# Patient Record
Sex: Male | Born: 1950 | Race: White | Hispanic: No | Marital: Married | Smoking: Never smoker
Health system: Southern US, Community
[De-identification: ages and names within clinical notes are randomized; demographics above are authoritative.]

## PROBLEM LIST (undated history)

## (undated) DIAGNOSIS — N400 Enlarged prostate without lower urinary tract symptoms: Secondary | ICD-10-CM

## (undated) DIAGNOSIS — M549 Dorsalgia, unspecified: Secondary | ICD-10-CM

## (undated) DIAGNOSIS — G8929 Other chronic pain: Secondary | ICD-10-CM

## (undated) DIAGNOSIS — H547 Unspecified visual loss: Secondary | ICD-10-CM

## (undated) DIAGNOSIS — S199XXA Unspecified injury of neck, initial encounter: Secondary | ICD-10-CM

## (undated) DIAGNOSIS — E7800 Pure hypercholesterolemia, unspecified: Secondary | ICD-10-CM

## (undated) DIAGNOSIS — Z9889 Other specified postprocedural states: Secondary | ICD-10-CM

## (undated) DIAGNOSIS — E785 Hyperlipidemia, unspecified: Secondary | ICD-10-CM

## (undated) DIAGNOSIS — B059 Measles without complication: Secondary | ICD-10-CM

## (undated) DIAGNOSIS — K219 Gastro-esophageal reflux disease without esophagitis: Secondary | ICD-10-CM

## (undated) DIAGNOSIS — B019 Varicella without complication: Secondary | ICD-10-CM

## (undated) DIAGNOSIS — E119 Type 2 diabetes mellitus without complications: Secondary | ICD-10-CM

## (undated) DIAGNOSIS — B269 Mumps without complication: Secondary | ICD-10-CM

## (undated) HISTORY — DX: Varicella without complication: B01.9

## (undated) HISTORY — DX: Unspecified visual loss: H54.7

## (undated) HISTORY — DX: Dorsalgia, unspecified: M54.9

## (undated) HISTORY — DX: Other chronic pain: G89.29

## (undated) HISTORY — DX: Mumps without complication: B26.9

## (undated) HISTORY — DX: Other specified postprocedural states: Z98.890

## (undated) HISTORY — DX: Gastro-esophageal reflux disease without esophagitis: K21.9

## (undated) HISTORY — DX: Type 2 diabetes mellitus without complications: E11.9

## (undated) HISTORY — PX: ADENOIDECTOMY: SUR15

## (undated) HISTORY — DX: Pure hypercholesterolemia, unspecified: E78.00

## (undated) HISTORY — PX: BACK SURGERY: SHX140

## (undated) HISTORY — DX: Measles without complication: B05.9

## (undated) HISTORY — PX: TONSILLECTOMY: SUR1361

## (undated) HISTORY — DX: Hyperlipidemia, unspecified: E78.5

## (undated) HISTORY — DX: Benign prostatic hyperplasia without lower urinary tract symptoms: N40.0

## (undated) HISTORY — PX: SPINE SURGERY: SHX786

## (undated) HISTORY — PX: GALLBLADDER SURGERY: SHX652

## (undated) HISTORY — DX: Unspecified injury of neck, initial encounter: S19.9XXA

---

## 2012-01-20 HISTORY — PX: COLONOSCOPY: SHX174

## 2017-09-19 DIAGNOSIS — E119 Type 2 diabetes mellitus without complications: Secondary | ICD-10-CM | POA: Insufficient documentation

## 2017-09-19 DIAGNOSIS — N521 Erectile dysfunction due to diseases classified elsewhere: Secondary | ICD-10-CM | POA: Insufficient documentation

## 2017-09-19 DIAGNOSIS — E782 Mixed hyperlipidemia: Secondary | ICD-10-CM | POA: Insufficient documentation

## 2017-11-01 DIAGNOSIS — R42 Dizziness and giddiness: Secondary | ICD-10-CM | POA: Insufficient documentation

## 2017-11-01 DIAGNOSIS — E119 Type 2 diabetes mellitus without complications: Secondary | ICD-10-CM | POA: Insufficient documentation

## 2017-11-01 DIAGNOSIS — R634 Abnormal weight loss: Secondary | ICD-10-CM | POA: Insufficient documentation

## 2017-11-01 DIAGNOSIS — J329 Chronic sinusitis, unspecified: Secondary | ICD-10-CM | POA: Insufficient documentation

## 2017-11-01 DIAGNOSIS — F4310 Post-traumatic stress disorder, unspecified: Secondary | ICD-10-CM | POA: Insufficient documentation

## 2017-11-01 DIAGNOSIS — R030 Elevated blood-pressure reading, without diagnosis of hypertension: Secondary | ICD-10-CM | POA: Insufficient documentation

## 2017-11-01 DIAGNOSIS — I4510 Unspecified right bundle-branch block: Secondary | ICD-10-CM | POA: Insufficient documentation

## 2017-11-01 DIAGNOSIS — I7121 Aneurysm of the ascending aorta, without rupture: Secondary | ICD-10-CM

## 2017-11-01 DIAGNOSIS — S46019A Strain of muscle(s) and tendon(s) of the rotator cuff of unspecified shoulder, initial encounter: Secondary | ICD-10-CM | POA: Insufficient documentation

## 2017-11-01 DIAGNOSIS — M25519 Pain in unspecified shoulder: Secondary | ICD-10-CM | POA: Insufficient documentation

## 2017-11-01 DIAGNOSIS — M545 Low back pain, unspecified: Secondary | ICD-10-CM | POA: Insufficient documentation

## 2017-11-01 DIAGNOSIS — G8929 Other chronic pain: Secondary | ICD-10-CM

## 2017-11-01 DIAGNOSIS — N400 Enlarged prostate without lower urinary tract symptoms: Secondary | ICD-10-CM | POA: Insufficient documentation

## 2017-11-01 DIAGNOSIS — R197 Diarrhea, unspecified: Secondary | ICD-10-CM | POA: Insufficient documentation

## 2017-11-01 DIAGNOSIS — K219 Gastro-esophageal reflux disease without esophagitis: Secondary | ICD-10-CM | POA: Insufficient documentation

## 2017-11-01 DIAGNOSIS — R0789 Other chest pain: Secondary | ICD-10-CM | POA: Insufficient documentation

## 2017-11-01 DIAGNOSIS — R5383 Other fatigue: Secondary | ICD-10-CM | POA: Insufficient documentation

## 2017-11-01 DIAGNOSIS — E559 Vitamin D deficiency, unspecified: Secondary | ICD-10-CM | POA: Insufficient documentation

## 2017-11-01 DIAGNOSIS — E785 Hyperlipidemia, unspecified: Secondary | ICD-10-CM

## 2017-11-01 DIAGNOSIS — R079 Chest pain, unspecified: Secondary | ICD-10-CM | POA: Insufficient documentation

## 2017-11-01 DIAGNOSIS — S39012A Strain of muscle, fascia and tendon of lower back, initial encounter: Secondary | ICD-10-CM | POA: Insufficient documentation

## 2017-11-01 DIAGNOSIS — Q2543 Congenital aneurysm of aorta: Secondary | ICD-10-CM | POA: Insufficient documentation

## 2017-11-01 DIAGNOSIS — R0609 Other forms of dyspnea: Secondary | ICD-10-CM | POA: Insufficient documentation

## 2017-11-01 DIAGNOSIS — I719 Aortic aneurysm of unspecified site, without rupture: Secondary | ICD-10-CM | POA: Insufficient documentation

## 2017-11-01 DIAGNOSIS — R911 Solitary pulmonary nodule: Secondary | ICD-10-CM

## 2017-11-22 ENCOUNTER — Ambulatory Visit (INDEPENDENT_AMBULATORY_CARE_PROVIDER_SITE_OTHER): Payer: Medicare Other | Admitting: Interventional Cardiology

## 2017-11-22 ENCOUNTER — Encounter (INDEPENDENT_AMBULATORY_CARE_PROVIDER_SITE_OTHER): Payer: Self-pay

## 2017-11-22 ENCOUNTER — Encounter: Payer: Self-pay | Admitting: Interventional Cardiology

## 2017-11-22 VITALS — BP 110/74 | HR 82 | Ht 73.0 in | Wt 183.8 lb

## 2017-11-22 DIAGNOSIS — I7789 Other specified disorders of arteries and arterioles: Secondary | ICD-10-CM | POA: Diagnosis not present

## 2017-11-22 DIAGNOSIS — I719 Aortic aneurysm of unspecified site, without rupture: Secondary | ICD-10-CM | POA: Diagnosis not present

## 2017-11-22 DIAGNOSIS — I7121 Aneurysm of the ascending aorta, without rupture: Secondary | ICD-10-CM

## 2017-11-22 DIAGNOSIS — I4510 Unspecified right bundle-branch block: Secondary | ICD-10-CM

## 2017-11-22 DIAGNOSIS — E119 Type 2 diabetes mellitus without complications: Secondary | ICD-10-CM | POA: Diagnosis not present

## 2017-11-22 DIAGNOSIS — E782 Mixed hyperlipidemia: Secondary | ICD-10-CM | POA: Diagnosis not present

## 2017-11-22 NOTE — Patient Instructions (Signed)
Medication Instructions:  Your physician recommends that you continue on your current medications as directed. Please refer to the Current Medication list given to you today.  Labwork: BMET today  Testing/Procedures: Your physician would like for you to have a CT angio or your Aorta.  Follow-Up: Your physician wants you to follow-up in: 1 year with Dr. Katrinka BlazingSmith.  You will receive a reminder letter in the mail two months in advance. If you don't receive a letter, please call our office to schedule the follow-up appointment.   Any Other Special Instructions Will Be Listed Below (If Applicable).     If you need a refill on your cardiac medications before your next appointment, please call your pharmacy.

## 2017-11-22 NOTE — Progress Notes (Signed)
Cardiology Office Note    Date:  11/22/2017   ID:  Scott Nichols, DOB [birth year], MRN [id number]  PCP:  Herma Carson, MD  Cardiologist: Lesleigh Noe, MD   Chief Complaint  Patient presents with  . Thoracic Aortic Aneurysm    History of Present Illness:  Scott Nichols is a 67 y.o. male with long-standing diabetes mellitus type 2, family history of CAD, family history of aortic dissection, personal history of right bundle branch block, and previous documentation of dilated aorta on echocardiography 2016.  The patient is asymptomatic.  He is diabetic for greater than 15 years..  He denies chest pain, orthopnea, PND, swelling, syncope, back pain, and edema.  He has had negative ischemic evaluations with stress echo test performed as recently as 18 months ago.  Most recent echo studies have not demonstrated dilated aorta.  Multiple noncontrast CT studies of the chest have not confirmed aortic enlargement.  No dedicated aortic studies have been done based upon my review of records.  We spent time discussing right bundle branch block and clinical implications.  We spent time discussing the aortic root enlargement.  The quoted value is 4 cm.  It was made by echo.  We need to exclude the possibility of a tangential cut resulting in falsely elevated diameters.  This is especially important given lack of confirmation of aortic enlargement on noncontrast chest CTs.  Past Medical History:  Diagnosis Date  . Back pain    back problems - herniated disc  . Chicken pox   . Chronic pain   . Diabetes mellitus without complication (HCC)   . Enlarged prostate   . GERD (gastroesophageal reflux disease)   . H/O colonoscopy   . High cholesterol   . Hyperlipidemia   . Measles   . Mumps   . Neck injury    during [place] War  . Vision problems       Current Medications: Outpatient Medications Prior to Visit  Medication Sig Dispense Refill  . citalopram (CELEXA) 20 MG tablet Take 20 mg by  mouth daily.    . Flaxseed, Linseed, (FLAXSEED OIL) 1000 MG CAPS Take 1,000 mg by mouth daily.    Marland Kitchen JANUVIA 50 MG tablet Take 50 mg by mouth daily.  0  . rosuvastatin (CRESTOR) 5 MG tablet Take 5 mg by mouth daily.    . sildenafil (VIAGRA) 100 MG tablet Take 100 mg by mouth daily as needed for erectile dysfunction.    . Cholecalciferol (VITAMIN D3) 5000 units CAPS Take 5,000 Units by mouth 3 (three) times a week. (Mondays, Wednesdays, and Fridays)    . Cyanocobalamin (VITAMIN B-12 PO) Take 2,500 mcg by mouth daily.    . metFORMIN (GLUCOPHAGE) 500 MG tablet Take 1,000 mg by mouth 2 (two) times daily with a meal.    . pioglitazone (ACTOS) 15 MG tablet Take 15 mg by mouth daily.     No facility-administered medications prior to visit.      Allergies:   Metformin and Zocor [simvastatin]   Social History   Socioeconomic History  . Marital status: Married    Spouse name: None  . Number of children: None  . Years of education: None  . Highest education level: None  Social Needs  . Financial resource strain: None  . Food insecurity - worry: None  . Food insecurity - inability: None  . Transportation needs - medical: None  . Transportation needs - non-medical: None  Occupational History  . None  Tobacco Use  . Smoking status: Never Smoker  . Smokeless tobacco: Never Used  Substance and Sexual Activity  . Alcohol use: No    Frequency: Never  . Drug use: No  . Sexual activity: None  Other Topics Concern  . None  Social History Narrative  . None     Family History:  The patient's family history includes Cancer - Prostate in his father; Congestive Heart Failure in his mother; Diabetes in his father and mother; Heart disease in his mother; Other (age of onset: 85) in his brother.   ROS:   Please see the history of present illness.    Negative review of systems. All other systems reviewed and are negative.   PHYSICAL EXAM:   VS:  BP 110/74   Pulse 82   Ht 6\' 1"  (1.854 m)    Wt 183 lb 12.8 oz (83.4 kg)   BMI 24.25 kg/m    GEN: Well nourished, well developed, in no acute distress  HEENT: normal  Neck: no JVD, carotid bruits, or masses Cardiac: RRR; no murmurs, rubs, or gallops,no edema  Respiratory:  clear to auscultation bilaterally, normal work of breathing GI: soft, nontender, nondistended, + BS MS: no deformity or atrophy  Skin: warm and dry, no rash Neuro:  Alert and Oriented x 3, Strength and sensation are intact Psych: euthymic mood, full affect  Wt Readings from Last 3 Encounters:  11/22/17 183 lb 12.8 oz (83.4 kg)      Studies/Labs Reviewed:   EKG:  EKG sinus rhythm, right bundle branch block, otherwise unremarkable.  No change when compared to historical EKGs performed in [place] in 2016.,  Recent Labs: No results found for requested labs within last 8760 hours.   Lipid Panel No results found for: CHOL, TRIG, HDL, CHOLHDL, VLDL, LDLCALC, LDLDIRECT  Additional studies/ records that were reviewed today include:  Reviewed stress echo from 2016 and multiple noncontrast CT reports.    ASSESSMENT:    1. Right bundle branch block   2. Type 2 diabetes mellitus without complication, without long-term current use of insulin (HCC)   3. Aneurysm of aortic root (HCC)   4. Mixed hyperlipidemia   5. Aortic root enlargement (HCC)      PLAN:  In order of problems listed above:  1. Unchanged 2. Managed by primary care 3. We will perform a CT angiogram of the aorta with contrast to define the presence or absence of aortic enlargement given the patient's family history of aortic dissection.  His younger brother had marfanoid features. 4. We discussed lipids and LDL target less than 70.  Clinical follow-up in 1 year.  We will discuss CT results once available.  Medication Adjustments/Labs and Tests Ordered: Current medicines are reviewed at length with the patient today.  Concerns regarding medicines are outlined above.  Medication  changes, Labs and Tests ordered today are listed in the Patient Instructions below. Patient Instructions  Medication Instructions:  Your physician recommends that you continue on your current medications as directed. Please refer to the Current Medication list given to you today.  Labwork: BMET today  Testing/Procedures: Your physician would like for you to have a CT angio or your Aorta.  Follow-Up: Your physician wants you to follow-up in: 1 year with Dr. Katrinka Blazing.  You will receive a reminder letter in the mail two months in advance. If you don't receive a letter, please call our office to schedule the follow-up appointment.   Any Other Special Instructions Will Be Listed  Below (If Applicable).     If you need a refill on your cardiac medications before your next appointment, please call your pharmacy.      Signed, Lesleigh NoeHenry W Akaila Rambo III, MD  11/22/2017 2:39 PM    [facility] [address], [place], [place]  [address] Phone: [phone]; Fax: [phone]

## 2017-11-27 ENCOUNTER — Other Ambulatory Visit: Payer: Medicare Other

## 2017-11-27 LAB — BASIC METABOLIC PANEL
BUN/Creatinine Ratio: 15 (ref 10–24)
BUN: 17 mg/dL (ref 8–27)
CHLORIDE: 101 mmol/L (ref 96–106)
CO2: 22 mmol/L (ref 20–29)
CREATININE: 1.11 mg/dL (ref 0.76–1.27)
Calcium: 9.9 mg/dL (ref 8.6–10.2)
GFR calc Af Amer: 79 mL/min/{1.73_m2} (ref >59)
GFR calc non Af Amer: 68 mL/min/{1.73_m2} (ref >59)
GLUCOSE: 212 mg/dL — AB (ref 65–99)
Potassium: 5.1 mmol/L (ref 3.5–5.2)
SODIUM: 137 mmol/L (ref 134–144)

## 2017-12-04 ENCOUNTER — Ambulatory Visit (INDEPENDENT_AMBULATORY_CARE_PROVIDER_SITE_OTHER)
Admission: RE | Admit: 2017-12-04 | Discharge: 2017-12-04 | Disposition: A | Discharge status: Home / Self Care | Payer: Medicare Other | Source: Ambulatory Visit | Attending: Interventional Cardiology | Admitting: Interventional Cardiology

## 2017-12-04 DIAGNOSIS — I7789 Other specified disorders of arteries and arterioles: Secondary | ICD-10-CM

## 2017-12-04 MED ORDER — IOPAMIDOL (ISOVUE-370) INJECTION 76%
100.0000 mL | Freq: Once | INTRAVENOUS | Status: AC | PRN
  Administered 2017-12-04: 100 mL via INTRAVENOUS

## 2017-12-05 ENCOUNTER — Telehealth: Payer: Self-pay | Admitting: *Deleted

## 2017-12-05 DIAGNOSIS — R911 Solitary pulmonary nodule: Secondary | ICD-10-CM

## 2017-12-05 NOTE — Telephone Encounter (Signed)
-----   Message from Lyn RecordsHenry W Smith, MD sent at 12/04/2017  9:39 PM EST ----- Let the patient know there is no aneurysm. There is a lung nodule which needs to be followed up in 3 months with repeat Chest CT.Primary Care should follow nodule. We can schedule. A copy will be sent to Herma CarsonMandry, Heidi, MD

## 2017-12-05 NOTE — Telephone Encounter (Signed)
Spoke with pt and went over results.  Pt verbalized understanding and was in agreement with this plan.  Order placed for f/u CT.

## 2018-01-16 ENCOUNTER — Other Ambulatory Visit: Payer: Self-pay | Admitting: *Deleted

## 2018-01-27 ENCOUNTER — Encounter (HOSPITAL_BASED_OUTPATIENT_CLINIC_OR_DEPARTMENT_OTHER): Payer: Self-pay | Admitting: Emergency Medicine

## 2018-01-27 ENCOUNTER — Emergency Department (HOSPITAL_BASED_OUTPATIENT_CLINIC_OR_DEPARTMENT_OTHER)
Admission: EM | Admit: 2018-01-27 | Discharge: 2018-01-27 | Disposition: A | Discharge status: Home / Self Care | Payer: Medicare Other | Attending: Emergency Medicine | Admitting: Emergency Medicine

## 2018-01-27 ENCOUNTER — Emergency Department (HOSPITAL_BASED_OUTPATIENT_CLINIC_OR_DEPARTMENT_OTHER): Payer: Medicare Other

## 2018-01-27 ENCOUNTER — Other Ambulatory Visit: Payer: Self-pay

## 2018-01-27 DIAGNOSIS — Y929 Unspecified place or not applicable: Secondary | ICD-10-CM | POA: Diagnosis not present

## 2018-01-27 DIAGNOSIS — Y999 Unspecified external cause status: Secondary | ICD-10-CM | POA: Diagnosis not present

## 2018-01-27 DIAGNOSIS — W0110XA Fall on same level from slipping, tripping and stumbling with subsequent striking against unspecified object, initial encounter: Secondary | ICD-10-CM | POA: Diagnosis not present

## 2018-01-27 DIAGNOSIS — Y9389 Activity, other specified: Secondary | ICD-10-CM | POA: Diagnosis not present

## 2018-01-27 DIAGNOSIS — S0990XA Unspecified injury of head, initial encounter: Secondary | ICD-10-CM | POA: Diagnosis not present

## 2018-01-27 DIAGNOSIS — E119 Type 2 diabetes mellitus without complications: Secondary | ICD-10-CM | POA: Insufficient documentation

## 2018-01-27 NOTE — ED Notes (Signed)
ED Provider at bedside. 

## 2018-01-27 NOTE — ED Triage Notes (Signed)
Pt tripped and fell over his cat, landing on concrete face down. Contusion noted to R side of forehead and abrasion noted to nose. Denies LOC. Denies neck or back pain. Does not take blood thinners.

## 2018-01-27 NOTE — ED Notes (Signed)
Patient transported to CT 

## 2018-01-27 NOTE — Discharge Instructions (Signed)

## 2018-01-27 NOTE — ED Provider Notes (Signed)
Emergency Department Provider Note   I have reviewed the triage vital signs and the nursing notes.   HISTORY  Chief Complaint Fall   HPI Scott Nichols is a 67 y.o. male with PMH of DM, HLD, GERD presents since to the emergency department for evaluation after head injury.  The patient was attempting to keep his cat from running outside when he lost his balance and fell forward.  He struck his face on the concrete floor which broke his glasses.  He has headache and abrasion to the forehead and nose.  He is noticed some feelings of being off balance like he is falling at times.  Denies any nausea or vomiting.  No confusion.  He is not anticoagulated.  Denies any midline neck pain.  Past Medical History:  Diagnosis Date  . Back pain    back problems - herniated disc  . Chicken pox   . Chronic pain   . Diabetes mellitus without complication (HCC)   . Enlarged prostate   . GERD (gastroesophageal reflux disease)   . H/O colonoscopy   . High cholesterol   . Hyperlipidemia   . Measles   . Mumps   . Neck injury    during [place] War  . Vision problems     Patient Active Problem List   Diagnosis Date Noted  . Diabetes mellitus (HCC) 11/01/2017  . Vitamin D deficiency 11/01/2017  . Hyperlipidemia 11/01/2017  . Posttraumatic stress disorder 11/01/2017  . Right bundle branch block 11/01/2017  . Aneurysm of aortic root (HCC) 11/01/2017  . Sinusitis 11/01/2017  . Gastroesophageal reflux disease 11/01/2017  . Benign prostatic hyperplasia 11/01/2017  . Shoulder joint pain 11/01/2017  . Chronic lower back pain 11/01/2017  . Dizziness 11/01/2017  . Unexplained weight loss 11/01/2017  . Dyspnea on exertion 11/01/2017  . Chest wall pain 11/01/2017  . Diarrhea 11/01/2017  . Elevated blood pressure reading 11/01/2017  . Strain of rotator cuff capsule 11/01/2017  . Low back strain 11/01/2017  . Solitary pulmonary nodule present on computed tomography of lung 11/01/2017  . Erectile  dysfunction due to diseases classified elsewhere 09/19/2017  . Type 2 diabetes mellitus without complication, without long-term current use of insulin (HCC) 09/19/2017    Past Surgical History:  Procedure Laterality Date  . ADENOIDECTOMY    . BACK SURGERY    . COLONOSCOPY  01/20/2012   Normal  . GALLBLADDER SURGERY    . SPINE SURGERY     c-spine discectomy  . TONSILLECTOMY      Current Outpatient Rx  . Order #: [id number] Class: Historical Med  . Order #: [id number] Class: Historical Med  . Order #: [id number] Class: Historical Med  . Order #: [id number] Class: Historical Med  . Order #: [id number] Class: Historical Med    Allergies Metformin and Zocor [simvastatin]  Family History  Problem Relation Age of Onset  . Diabetes Mother   . Congestive Heart Failure Mother   . Heart disease Mother   . Cancer - Prostate Father   . Diabetes Father   . Other Brother 58       dissection of abdominal aorta    Social History Social History   Tobacco Use  . Smoking status: Never Smoker  . Smokeless tobacco: Never Used  Substance Use Topics  . Alcohol use: No    Frequency: Never  . Drug use: No    Review of Systems  Constitutional: No fever/chills Eyes: No visual changes. ENT: No sore throat. Positive nose swelling and pain.  Cardiovascular: Denies chest pain. Respiratory: Denies shortness of breath. Gastrointestinal: No abdominal pain.  No nausea, no vomiting.  No diarrhea.  No constipation. Genitourinary: Negative for dysuria. Musculoskeletal: Negative for back pain. Skin: Negative for rash. Neurological: Negative for focal weakness or numbness. Positive HA.   10-point ROS otherwise negative.  ____________________________________________   PHYSICAL EXAM:  VITAL SIGNS: ED Triage Vitals  Enc Vitals Group     BP 01/27/18 1548 118/80     Pulse Rate 01/27/18 1548 76     Resp 01/27/18 1548 20     Temp 01/27/18 1548 98.9 F (37.2 C)     Temp Source 01/27/18 1548 Oral       SpO2 01/27/18 1548 98 %     Weight 01/27/18 1547 175 lb (79.4 kg)     Height 01/27/18 1547 6\' 1"  (1.854 m)     Pain Score 01/27/18 1547 7   Constitutional: Alert and oriented. Well appearing and in no acute distress. Eyes: Conjunctivae are normal. PERRL.  Head: Small (3 cm) hematoma to the forehead without laceration.  Nose: No congestion/rhinnorhea. Mouth/Throat: Mucous membranes are moist.   Neck: No stridor. No cervical spine tenderness to palpation. Cardiovascular: Normal rate, regular rhythm. Good peripheral circulation. Grossly normal heart sounds.   Respiratory: Normal respiratory effort.  No retractions. Lungs CTAB. Gastrointestinal: No distention.  Musculoskeletal: No lower extremity tenderness nor edema. No gross deformities of extremities. Neurologic:  Normal speech and language. No gross focal neurologic deficits are appreciated.  Skin:  Skin is warm, dry and intact. No rash noted.  ____________________________________________  RADIOLOGY  Ct Head Wo Contrast  Result Date: 01/27/2018 CLINICAL DATA:  Fall today, striking forehead. No loss of consciousness. Headache. EXAM: CT HEAD WITHOUT CONTRAST TECHNIQUE: Contiguous axial images were obtained from the base of the skull through the vertex without intravenous contrast. COMPARISON:  None. FINDINGS: Brain: There is no evidence of acute intracranial hemorrhage, mass lesion, brain edema or extra-axial fluid collection. The ventricles and subarachnoid spaces are appropriately sized for age. There is no CT evidence of acute cortical infarction. Vascular: Mild intracranial vascular calcifications. No hyperdense vessel identified. Skull: Negative for fracture or focal lesion. Sinuses/Orbits: There is partial opacification of the ethmoid air cells on the right without air-fluid levels. The additional visualized paranasal sinuses, mastoid air cells and middle ears are clear. Other: Focal soft tissue swelling in right frontal scalp.  IMPRESSION: 1. Focal soft tissue swelling in the right frontal scalp. No underlying calvarial fracture. 2. No acute intracranial findings. 3. Partial right ethmoid sinus opacification. Electronically Signed   By: Carey BullocksWilliam  Veazey M.D.   On: 01/27/2018 16:57    ____________________________________________   PROCEDURES  Procedure(s) performed:   Procedures  None ____________________________________________   INITIAL IMPRESSION / ASSESSMENT AND PLAN / ED COURSE  Pertinent labs & imaging results that were available during my care of the patient were reviewed by me and considered in my medical decision making (see chart for details).  Patient presents to the emergency department for evaluation of head injury after tripping and falling on his face.  No anticoagulation.  He has an abrasion to the forehead but no laceration.  Some mild swelling to the bridge of the nose.  Patient with no midline neck discomfort.  Plan for CT imaging of the head with him feeling intermittently lightheaded but otherwise no imaging.  Patient may have a mild concussion and discussed this with him.  CT imaging reviewed with no acute findings.   At this time,  I do not feel there is any life-threatening condition present. I have reviewed and discussed all results (EKG, imaging, lab, urine as appropriate), exam findings with patient. I have reviewed nursing notes and appropriate previous records.  I feel the patient is safe to be discharged home without further emergent workup. Discussed usual and customary return precautions. Patient and family (if present) verbalize understanding and are comfortable with this plan.  Patient will follow-up with their primary care provider. If they do not have a primary care provider, information for follow-up has been provided to them. All questions have been answered.  ____________________________________________  FINAL CLINICAL IMPRESSION(S) / ED DIAGNOSES  Final diagnoses:  Injury  of head, initial encounter    Note:  This document was prepared using Dragon voice recognition software and may include unintentional dictation errors.  Alona Bene, MD Emergency Medicine    Long, Arlyss Repress, MD 01/28/18 1030

## 2018-03-05 ENCOUNTER — Other Ambulatory Visit: Payer: Medicare Other

## 2018-11-01 ENCOUNTER — Encounter: Payer: Self-pay | Admitting: Interventional Cardiology

## 2018-12-01 NOTE — Progress Notes (Signed)
Cardiology Office Note:    Date:  12/02/2018   ID:  Scott Nichols, DOB 1951-09-05, MRN [id number]  PCP:  Herma Carson, MD  Cardiologist:  Lesleigh Noe, MD   Referring MD: Herma Carson, MD   Chief Complaint  Patient presents with  . Follow-up    Risk modification    History of Present Illness:    Scott Nichols is a 68 y.o. male with a hx of diabetes mellitus type 2, family history of CAD, family history of aortic dissection, personal history of right bundle branch block, and previous documentation of dilated aorta on echocardiography 2016.  Scott Nichols is doing well.  He has made dramatic improvement and risk modification.  Hemoglobin A1c is down.  LDL is near target of 70 (statin intolerant), he is physically active getting greater than 150 minutes of moderate activity per week.  No medication side effects.  He has primary care who is prevention aware.  He denies palpitations, syncope, and exertional dyspnea.  Past Medical History:  Diagnosis Date  . Back pain    back problems - herniated disc  . Chicken pox   . Chronic pain   . Diabetes mellitus without complication (HCC)   . Enlarged prostate   . GERD (gastroesophageal reflux disease)   . H/O colonoscopy   . High cholesterol   . Hyperlipidemia   . Measles   . Mumps   . Neck injury    during [place] War  . Vision problems     Past Surgical History:  Procedure Laterality Date  . ADENOIDECTOMY    . BACK SURGERY    . COLONOSCOPY  01/20/2012   Normal  . GALLBLADDER SURGERY    . SPINE SURGERY     c-spine discectomy  . TONSILLECTOMY      Current Medications: Current Meds  Medication Sig  . citalopram (CELEXA) 20 MG tablet Take 20 mg by mouth daily.  . Flaxseed, Linseed, (FLAXSEED OIL) 1000 MG CAPS Take 4,000 mg by mouth daily.   Marland Kitchen glipiZIDE (GLUCOTROL XL) 5 MG 24 hr tablet Take 5 mg by mouth daily.  . metFORMIN (GLUCOPHAGE) 500 MG tablet Take 1,000 mg by mouth 2 (two) times daily.  . rosuvastatin (CRESTOR) 5  MG tablet Take 5 mg by mouth daily.  . sildenafil (VIAGRA) 100 MG tablet Take 100 mg by mouth daily as needed for erectile dysfunction.     Allergies:   Metformin and Zocor [simvastatin]   Social History   Socioeconomic History  . Marital status: Married    Spouse name: Not on file  . Number of children: Not on file  . Years of education: Not on file  . Highest education level: Not on file  Occupational History  . Not on file  Social Needs  . Financial resource strain: Not on file  . Food insecurity:    Worry: Not on file    Inability: Not on file  . Transportation needs:    Medical: Not on file    Non-medical: Not on file  Tobacco Use  . Smoking status: Never Smoker  . Smokeless tobacco: Never Used  Substance and Sexual Activity  . Alcohol use: No    Frequency: Never  . Drug use: No  . Sexual activity: Not on file  Lifestyle  . Physical activity:    Days per week: Not on file    Minutes per session: Not on file  . Stress: Not on file  Relationships  . Social connections:  Talks on phone: Not on file    Gets together: Not on file    Attends religious service: Not on file    Active member of club or organization: Not on file    Attends meetings of clubs or organizations: Not on file    Relationship status: Not on file  Other Topics Concern  . Not on file  Social History Narrative  . Not on file     Family History: The patient's family history includes Cancer - Prostate in his father; Congestive Heart Failure in his mother; Diabetes in his father and mother; Heart disease in his mother; Other (age of onset: 10658) in his brother.  ROS:   Please see the history of present illness.    Muscle aches on higher doses of statins all other systems reviewed and are negative.  EKGs/Labs/Other Studies Reviewed:    The following studies were reviewed today:  Chest CT scan 12/04/2017: IMPRESSION: No acute finding.  Greatest diameter of the ascending thoracic aorta  measures 3.7 cm. There is minimal atherosclerotic change of the thoracic aorta.  There is a 1 cm nodule at the periphery of the right lower lobe, with associated adjacent tree-in-bud nodularity. Consider one of the following in 3 months for both low-risk and high-risk individuals: (a) repeat chest CT, (b) follow-up PET-CT, or (c) tissue sampling. This recommendation follows the consensus statement: Guidelines for Management of Incidental Pulmonary Nodules Detected on CT Images: From the Fleischner Society 2017; Radiology 2017; 284:228-243.  Low-density lesion of the anterior mediastinum, 2.3 cm. Most likely the lesion is associated with thymic tissue, potentially a thymic cyst or thymoma. Less likely, this could represent adenopathy if the lesion of the right lower lobe proved to be malignant. Attention on future surveillance imaging is recommended, with or without referral for CT surgery evaluation.  Single phase imaging of the liver demonstrates 2 separate hyperenhancing regions. Although most likely benign vascular lesions, these are not completely characterized on the current CT. Further imaging assessment may be warranted, such as contrast-enhanced CT or MRI.   Electronically Signed   By: Gilmer MorJaime  Wagner D.O.   On: 12/04/2017 16:29   EKG:  EKG right bundle branch block, otherwise unremarkable.  Recent Labs: No results found for requested labs within last 8760 hours.  Recent Lipid Panel No results found for: CHOL, TRIG, HDL, CHOLHDL, VLDL, LDLCALC, LDLDIRECT  Physical Exam:    VS:  BP 124/78   Pulse 74   Ht 6\' 1"  (1.854 m)   Wt 183 lb 9.6 oz (83.3 kg)   SpO2 97%   BMI 24.22 kg/m     Wt Readings from Last 3 Encounters:  12/02/18 183 lb 9.6 oz (83.3 kg)  01/27/18 175 lb (79.4 kg)  11/22/17 183 lb 12.8 oz (83.4 kg)     GEN: Appears younger than stated age. No acute distress HEENT: Normal NECK: No JVD. LYMPHATICS: No lymphadenopathy CARDIAC: RRR.  No  murmur, no gallop, no edema VASCULAR: 2+ bilateral carotid, radial, and posterior tibial pulses, no bruits RESPIRATORY:  Clear to auscultation without rales, wheezing or rhonchi  ABDOMEN: Soft, non-tender, non-distended, No pulsatile mass, MUSCULOSKELETAL: No deformity  SKIN: Warm and dry NEUROLOGIC:  Alert and oriented x 3 PSYCHIATRIC:  Normal affect   ASSESSMENT:    1. Mixed hyperlipidemia   2. Aneurysm of aortic root (HCC)   3. Type 2 diabetes mellitus without complication, without long-term current use of insulin (HCC)   4. Right bundle branch block  PLAN:    In order of problems listed above:  1. LDL is 88.  Zetia could be added to get target less than 70. 2. Current excellent blood pressure.  No evidence of aneurysm.  His brother with aortic dissection had Marfan's. 3. Hemoglobin A1c is now less than 7. 4. Unchanged.  Overall education and awareness concerning primary/secondary risk prevention was discussed in detail: LDL less than 70, hemoglobin A1c less than 7, blood pressure target less than 130/80 mmHg, >150 minutes of moderate aerobic activity per week, avoidance of smoking, weight control (via diet and exercise), and continued surveillance/management of/for obstructive sleep apnea.  He is sitting all targets noted above.  Clinical follow-up in 1 year.   Medication Adjustments/Labs and Tests Ordered: Current medicines are reviewed at length with the patient today.  Concerns regarding medicines are outlined above.  Orders Placed This Encounter  Procedures  . EKG 12-Lead   No orders of the defined types were placed in this encounter.   Patient Instructions  Medication Instructions:  Your physician recommends that you continue on your current medications as directed. Please refer to the Current Medication list given to you today.  If you need a refill on your cardiac medications before your next appointment, please call your pharmacy.   Lab work: None  ordered If you have labs (blood work) drawn today and your tests are completely normal, you will receive your results only by: Marland Kitchen. MyChart Message (if you have MyChart) OR . A paper copy in the mail If you have any lab test that is abnormal or we need to change your treatment, we will call you to review the results.  Testing/Procedures: None ordered  Follow-Up: At [facility], you and your health needs are our priority.  As part of our continuing mission to provide you with exceptional heart care, we have created designated Provider Care Teams.  These Care Teams include your primary Cardiologist (physician) and Advanced Practice Providers (APPs -  Physician Assistants and Nurse Practitioners) who all work together to provide you with the care you need, when you need it. . You will need a follow up appointment in 1 year.  Please call our office 2 months in advance to schedule this appointment.  You may see Lesleigh NoeHenry W Smith III, MD or one of the following Advanced Practice Providers on your designated Care Team:   . Norma FredricksonLori Gerhardt, NP . Nada BoozerLaura Ingold, NP . Georgie ChardJill McDaniel, NP   Any Other Special Instructions Will Be Listed Below (If Applicable).     Signed, Lesleigh NoeHenry W Smith III, MD  12/02/2018 3:28 PM    [facility]

## 2018-12-02 ENCOUNTER — Ambulatory Visit (INDEPENDENT_AMBULATORY_CARE_PROVIDER_SITE_OTHER): Payer: Medicare Other | Admitting: Interventional Cardiology

## 2018-12-02 ENCOUNTER — Encounter: Payer: Self-pay | Admitting: Interventional Cardiology

## 2018-12-02 VITALS — BP 124/78 | HR 74 | Ht 73.0 in | Wt 183.6 lb

## 2018-12-02 DIAGNOSIS — I719 Aortic aneurysm of unspecified site, without rupture: Secondary | ICD-10-CM | POA: Diagnosis not present

## 2018-12-02 DIAGNOSIS — I7121 Aneurysm of the ascending aorta, without rupture: Secondary | ICD-10-CM

## 2018-12-02 DIAGNOSIS — I4510 Unspecified right bundle-branch block: Secondary | ICD-10-CM | POA: Diagnosis not present

## 2018-12-02 DIAGNOSIS — E782 Mixed hyperlipidemia: Secondary | ICD-10-CM

## 2018-12-02 DIAGNOSIS — E119 Type 2 diabetes mellitus without complications: Secondary | ICD-10-CM

## 2018-12-02 NOTE — Patient Instructions (Signed)
Medication Instructions:  Your physician recommends that you continue on your current medications as directed. Please refer to the Current Medication list given to you today.  If you need a refill on your cardiac medications before your next appointment, please call your pharmacy.   Lab work: None ordered If you have labs (blood work) drawn today and your tests are completely normal, you will receive your results only by: . MyChart Message (if you have MyChart) OR . A paper copy in the mail If you have any lab test that is abnormal or we need to change your treatment, we will call you to review the results.  Testing/Procedures: None ordered  Follow-Up: At [facility], you and your health needs are our priority.  As part of our continuing mission to provide you with exceptional heart care, we have created designated Provider Care Teams.  These Care Teams include your primary Cardiologist (physician) and Advanced Practice Providers (APPs -  Physician Assistants and Nurse Practitioners) who all work together to provide you with the care you need, when you need it. . You will need a follow up appointment in 1 year.  Please call our office 2 months in advance to schedule this appointment.  You may see Henry W Smith III, MD or one of the following Advanced Practice Providers on your designated Care Team:   . Lori Gerhardt, NP . Laura Ingold, NP . Jill McDaniel, NP   Any Other Special Instructions Will Be Listed Below (If Applicable).  

## 2019-02-10 ENCOUNTER — Other Ambulatory Visit: Payer: Self-pay | Admitting: *Deleted

## 2019-02-10 DIAGNOSIS — R911 Solitary pulmonary nodule: Secondary | ICD-10-CM

## 2019-02-25 ENCOUNTER — Telehealth: Payer: Self-pay | Admitting: *Deleted

## 2019-02-25 NOTE — Addendum Note (Signed)
Addended by: Rayfield Citizen on: 02/25/2019 08:46 AM   Modules accepted: Orders

## 2019-02-25 NOTE — Telephone Encounter (Signed)
Called spoke with patient about scheduling FU CT for Dr Katrinka Blazing. Patient refused to have CT due to COVID restrictions at this time. Patient was given call back number [phone] to schedule CT when he feels it is safe. Will make Victorino Dike and Dr Katrinka Blazing aware.

## 2019-12-05 ENCOUNTER — Ambulatory Visit: Payer: Self-pay

## 2019-12-09 IMAGING — CT CT HEAD W/O CM
3 series · 15 of 47 positions shown, 18 images · non-contrast
Comparison: None.

CLINICAL DATA: Fall today, striking forehead. No loss of
consciousness. Headache.

EXAM:
CT HEAD WITHOUT CONTRAST
TECHNIQUE: Contiguous axial images were obtained from the base of the skull
through the vertex without intravenous contrast.

[Series 2: head wo · axial · 0.49mm/px · z∈[-177,-37]mm · 9 of 34 slices shown, 12 images]
[im 3/34  brain]
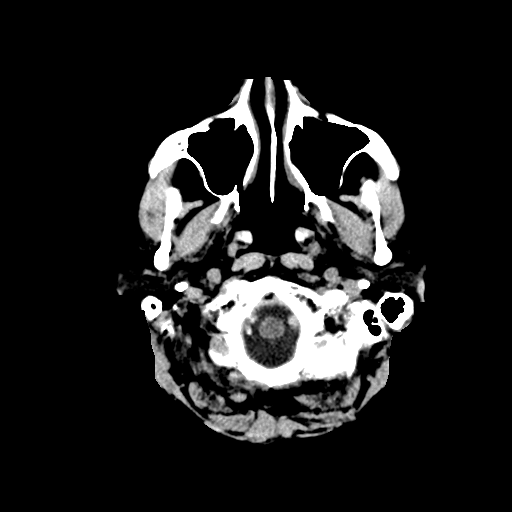
[im 3/34  bone]
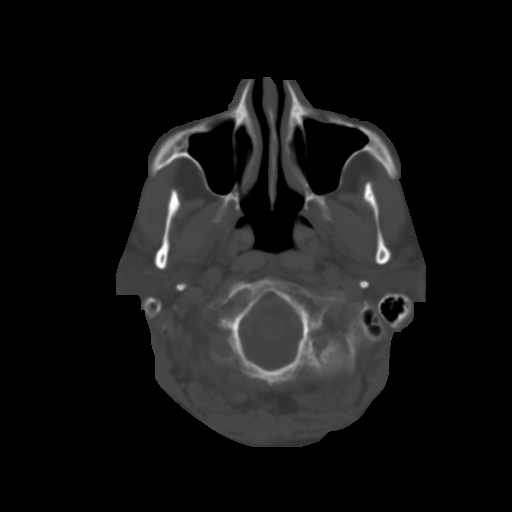
[im 6/34  brain]
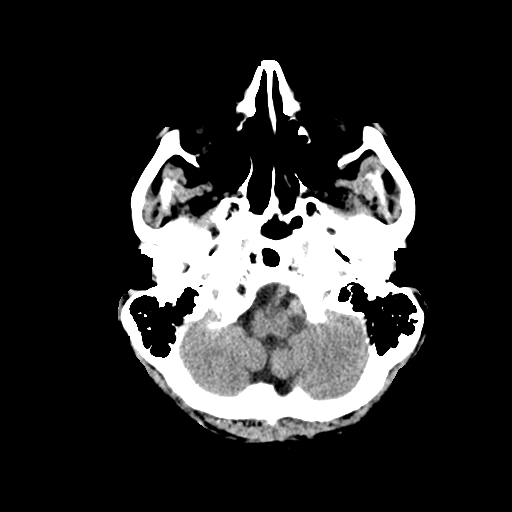
[im 10/34  brain]
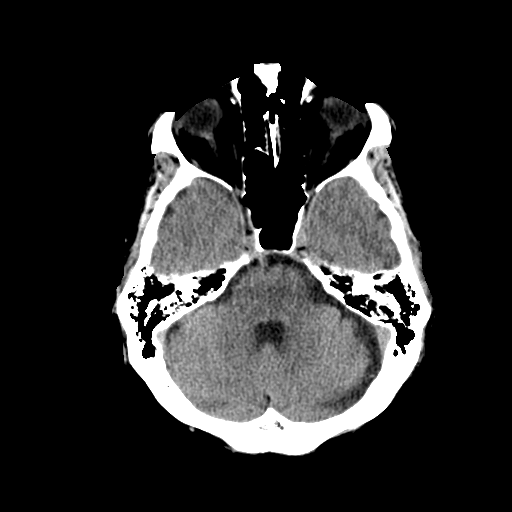
[im 13/34  brain]
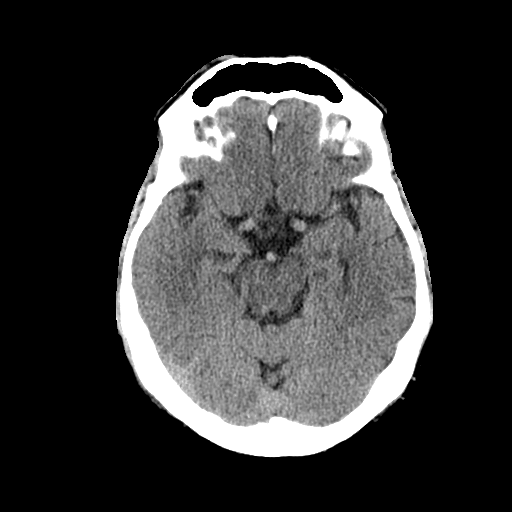
[im 18/34  brain]
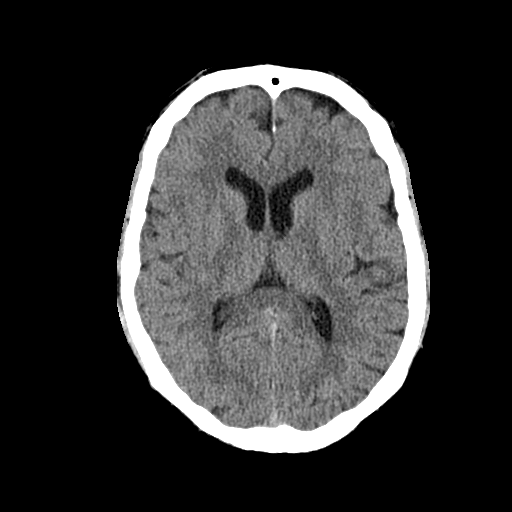
[im 18/34  bone]
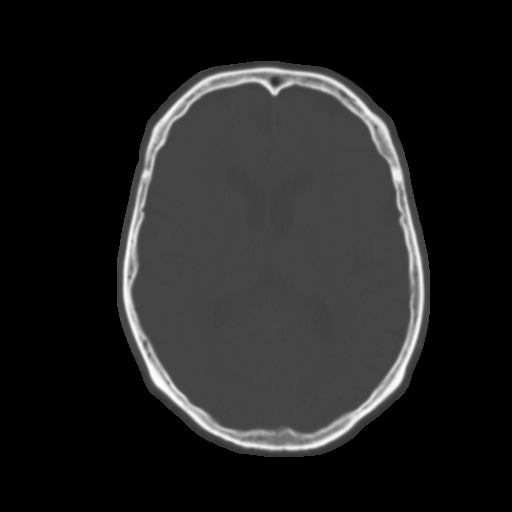
[im 21/34  brain]
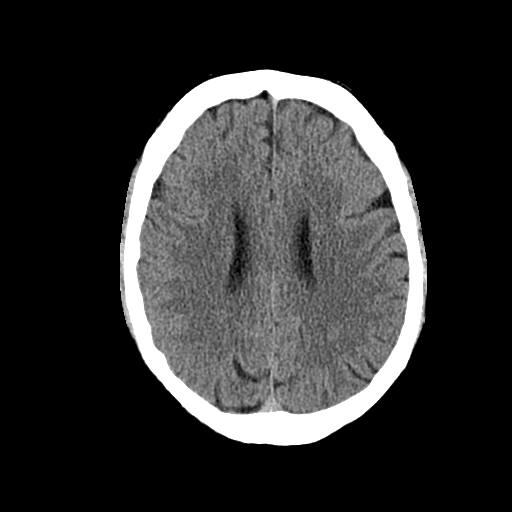
[im 24/34  brain]
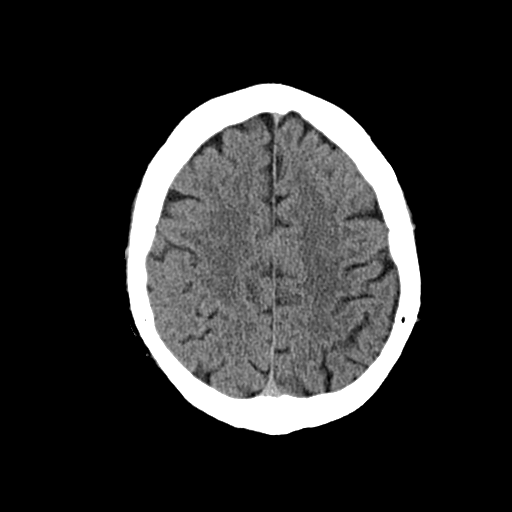
[im 28/34  brain]
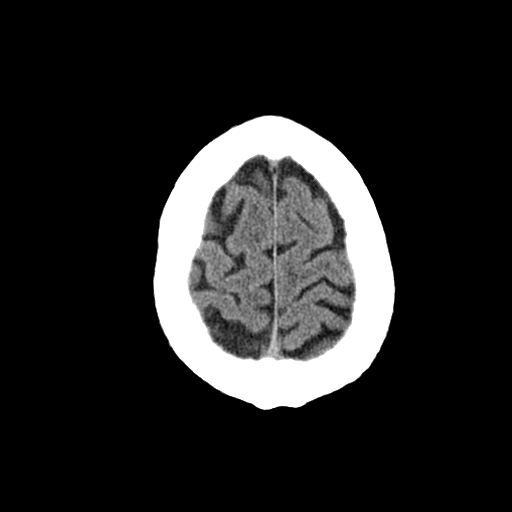
[im 31/34  brain]
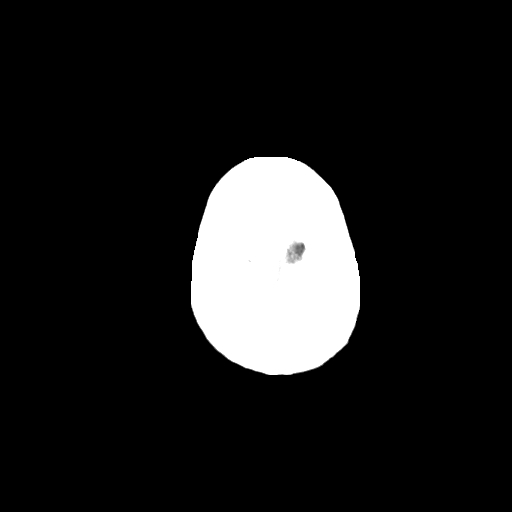
[im 31/34  bone]
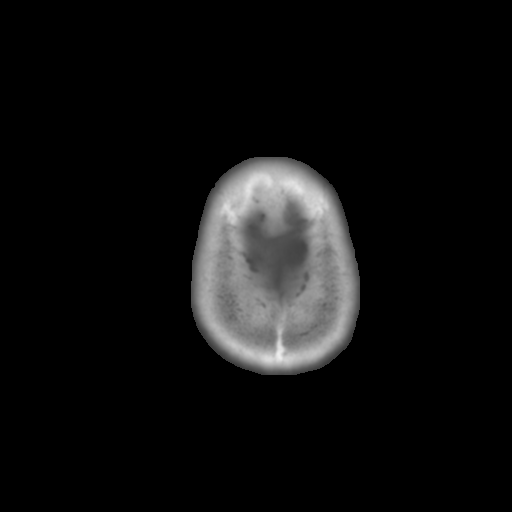

[Series 4: cor soft · coronal · 0.38mm/px · 3 of 70 slices shown]
[im 24/70  brain]
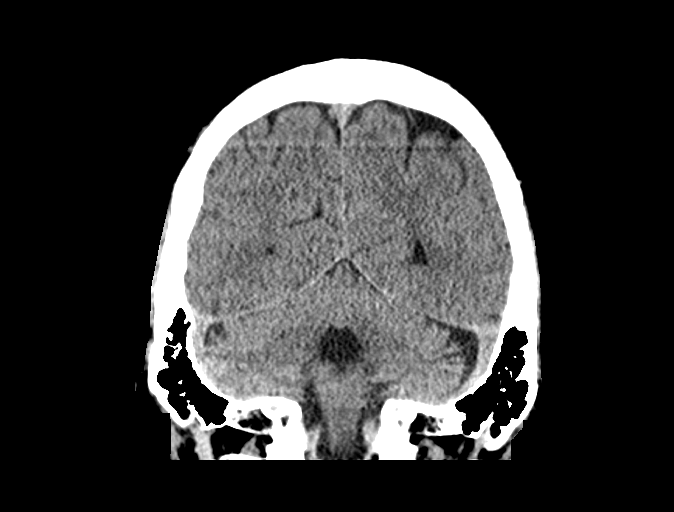
[im 31/70  brain]
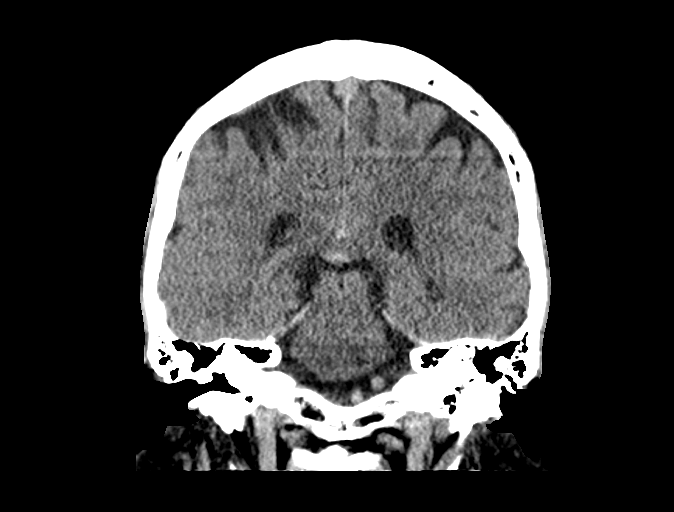
[im 39/70  brain]
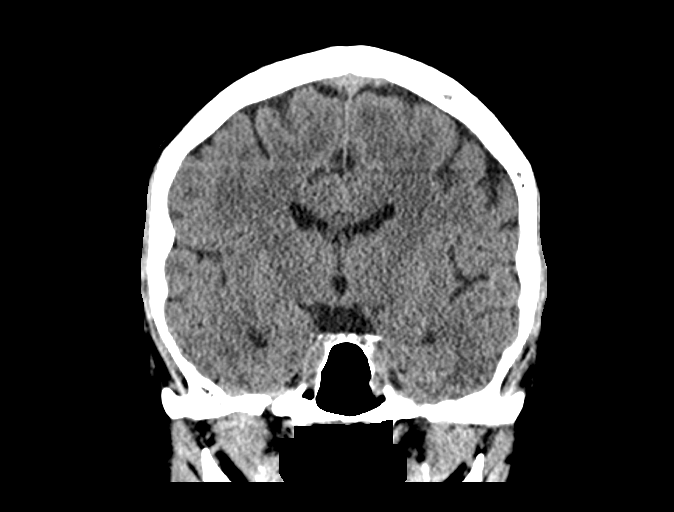

[Series 5: sag soft · sagittal · 0.38mm/px · 3 of 63 slices shown]
[im 21/63  brain]
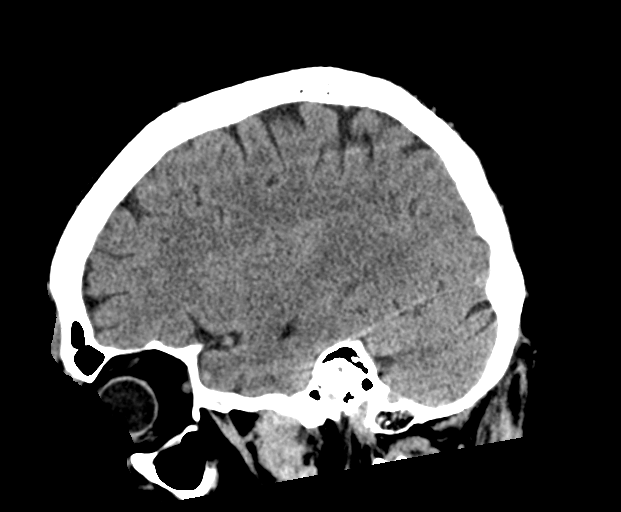
[im 32/63  brain]
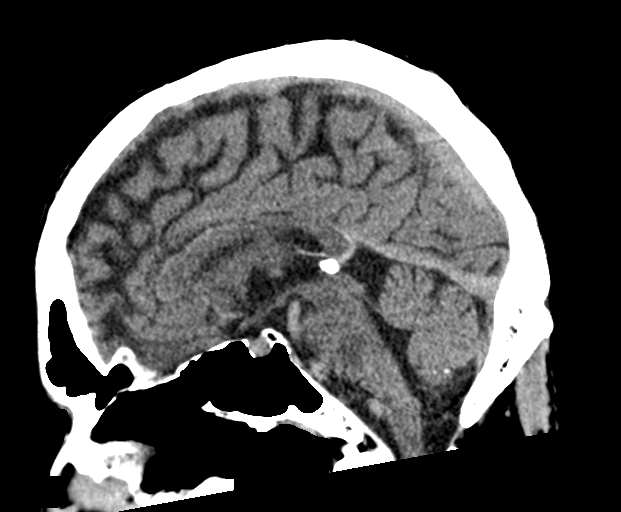
[im 42/63  brain]
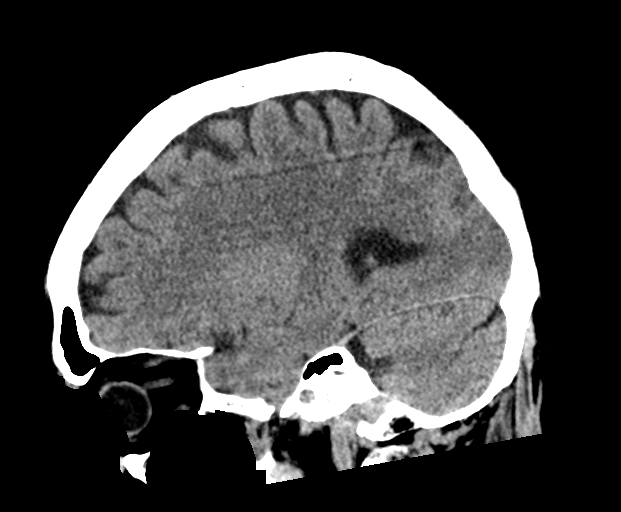

[15 of 47 positions shown; findings below may reference images not displayed]

FINDINGS: Brain: There is no evidence of acute intracranial hemorrhage, mass
lesion, brain edema or extra-axial fluid collection. The ventricles
and subarachnoid spaces are appropriately sized for age. There is no
CT evidence of acute cortical infarction.

Vascular: Mild intracranial vascular calcifications. No hyperdense
vessel identified.

Skull: Negative for fracture or focal lesion.

Sinuses/Orbits: There is partial opacification of the ethmoid air
cells on the right without air-fluid levels. The additional
visualized paranasal sinuses, mastoid air cells and middle ears are
clear.

Other: Focal soft tissue swelling in right frontal scalp.
IMPRESSION: 1. Focal soft tissue swelling in the right frontal scalp. No
underlying calvarial fracture.
2. No acute intracranial findings.
3. Partial right ethmoid sinus opacification.

## 2019-12-13 ENCOUNTER — Ambulatory Visit: Payer: Medicare Other

## 2019-12-22 ENCOUNTER — Ambulatory Visit: Payer: Medicare Other

## 2019-12-31 ENCOUNTER — Ambulatory Visit: Payer: Medicare Other | Admitting: Interventional Cardiology

## 2020-01-29 NOTE — Progress Notes (Signed)
Cardiology Office Note:    Date:  01/30/2020   ID:  Scott Nichols, DOB 1951/03/18, MRN [id number]  PCP:  Herma Carson, MD  Cardiologist:  Lesleigh Noe, MD   Referring MD: Herma Carson, MD   Chief Complaint  Patient presents with  . Advice Only    Aortic atherosclerosis  . Hyperlipidemia    History of Present Illness:    Scott Nichols is a 69 y.o. male with a hx of diabetes mellitus type 2, family history of CAD, family history of aortic dissection, personal history of right bundle branch block, and previous documentation of dilated aorta on echocardiography 2016.  He is doing well. He is physically active. He has no cardiac complaints. He denies palpitations. No orthopnea, PND, or other complaints.  No medication side effects.  Past Medical History:  Diagnosis Date  . Back pain    back problems - herniated disc  . Chicken pox   . Chronic pain   . Diabetes mellitus without complication (HCC)   . Enlarged prostate   . GERD (gastroesophageal reflux disease)   . H/O colonoscopy   . High cholesterol   . Hyperlipidemia   . Measles   . Mumps   . Neck injury    during [place] War  . Vision problems     Past Surgical History:  Procedure Laterality Date  . ADENOIDECTOMY    . BACK SURGERY    . COLONOSCOPY  01/20/2012   Normal  . GALLBLADDER SURGERY    . SPINE SURGERY     c-spine discectomy  . TONSILLECTOMY      Current Medications: Current Meds  Medication Sig  . citalopram (CELEXA) 20 MG tablet Take 20 mg by mouth daily.  . Flaxseed, Linseed, (FLAXSEED OIL) 1000 MG CAPS Take 4,000 mg by mouth daily.   Marland Kitchen glipiZIDE (GLUCOTROL XL) 5 MG 24 hr tablet Take 5 mg by mouth daily.  . metFORMIN (GLUCOPHAGE) 500 MG tablet Take 1,000 mg by mouth 2 (two) times daily.  . rosuvastatin (CRESTOR) 5 MG tablet Take 5 mg by mouth daily.  . sildenafil (VIAGRA) 100 MG tablet Take 100 mg by mouth daily as needed for erectile dysfunction.  . vitamin B-12 (CYANOCOBALAMIN) 500 MCG  tablet Take 500 mcg by mouth daily.  . Vitamin D, Ergocalciferol, (DRISDOL) 1.25 MG (50000 UNIT) CAPS capsule Take 50,000 Units by mouth every 7 (seven) days.     Allergies:   Metformin and Zocor [simvastatin]   Social History   Socioeconomic History  . Marital status: Married    Spouse name: Not on file  . Number of children: Not on file  . Years of education: Not on file  . Highest education level: Not on file  Occupational History  . Not on file  Tobacco Use  . Smoking status: Never Smoker  . Smokeless tobacco: Never Used  Substance and Sexual Activity  . Alcohol use: No  . Drug use: No  . Sexual activity: Not on file  Other Topics Concern  . Not on file  Social History Narrative  . Not on file   Social Determinants of Health   Financial Resource Strain:   . Difficulty of Paying Living Expenses:   Food Insecurity:   . Worried About Programme researcher, broadcasting/film/video in the Last Year:   . Barista in the Last Year:   Transportation Needs:   . Freight forwarder (Medical):   Marland Kitchen Lack of Transportation (Non-Medical):   Physical Activity:   .  Days of Exercise per Week:   . Minutes of Exercise per Session:   Stress:   . Feeling of Stress :   Social Connections:   . Frequency of Communication with Friends and Family:   . Frequency of Social Gatherings with Friends and Family:   . Attends Religious Services:   . Active Member of Clubs or Organizations:   . Attends Banker Meetings:   Marland Kitchen Marital Status:      Family History: The patient's family history includes Cancer - Prostate in his father; Congestive Heart Failure in his mother; Diabetes in his father and mother; Heart disease in his mother; Other (age of onset: 37) in his brother.  ROS:   Please see the history of present illness.    Has a history of lung nodules. Had a biopsy done about 6 years ago that revealed benign disease. The most recent CT scan in 2019 demonstrated small nodules. He has never  been a smoker. He is physically active. 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 He works out every day with weights and a recumbent bicycle getting more than 30 minutes on each occasion. His knees prevent much walking. The recumbent bicycle allows him to get cardio without his knees flaring up. All other systems reviewed and are negative.  EKGs/Labs/Other Studies Reviewed:    The following studies were reviewed today: CT scan 2019: IMPRESSION: No acute finding.  Greatest diameter of the ascending thoracic aorta measures 3.7 cm. There is minimal atherosclerotic change of the thoracic aorta.  There is a 1 cm nodule at the periphery of the right lower lobe, with associated adjacent tree-in-bud nodularity. Consider one of the following in 3 months for both low-risk and high-risk individuals: (a) repeat chest CT, (b) follow-up PET-CT, or (c) tissue sampling. This recommendation follows the consensus statement: Guidelines for Management of Incidental Pulmonary Nodules Detected on CT Images: From the Fleischner Society 2017; Radiology 2017; 284:228-243.  Low-density lesion of the anterior mediastinum, 2.3 cm. Most likely the lesion is associated with thymic tissue, potentially a thymic cyst or thymoma. Less likely, this could represent adenopathy if the lesion of the right lower lobe proved to be malignant. Attention on future surveillance imaging is recommended, with or without referral for CT surgery evaluation.  Single phase imaging of the liver demonstrates 2 separate hyperenhancing regions. Although most likely benign vascular lesions, these are not completely characterized on the current CT. Further imaging assessment may be warranted, such as contrast-enhanced CT or MRI.   EKG:  EKG right bundle branch block, sinus rhythm, normal sinus rhythm, no change when compared to prior.  Recent Labs: No results found for requested labs within last 8760 hours.  Recent  Lipid Panel No results found for: CHOL, TRIG, HDL, CHOLHDL, VLDL, LDLCALC, LDLDIRECT  Physical Exam:    VS:  BP 110/66   Pulse 88   Ht 6\' 1"  (1.854 m)   Wt 171 lb 12.8 oz (77.9 kg)   SpO2 98%   BMI 22.67 kg/m     Wt Readings from Last 3 Encounters:  01/30/20 171 lb 12.8 oz (77.9 kg)  12/02/18 183 lb 9.6 oz (83.3 kg)  01/27/18 175 lb (79.4 kg)     GEN: Slender.. No acute distress HEENT: Normal NECK: No JVD. LYMPHATICS: No lymphadenopathy CARDIAC:  RRR without murmur, gallop, or edema. VASCULAR:  Normal Pulses. No bruits. RESPIRATORY:  Clear to auscultation without rales, wheezing or rhonchi  ABDOMEN: Soft, non-tender, non-distended,  No pulsatile mass, MUSCULOSKELETAL: No deformity  SKIN: Warm and dry NEUROLOGIC:  Alert and oriented x 3 PSYCHIATRIC:  Normal affect   ASSESSMENT:    1. Aneurysm of aortic root ([place])   2. Mixed hyperlipidemia   3. Type 2 diabetes mellitus without complication, without long-term current use of insulin ([place])   4. Right bundle branch block   5. Aortic atherosclerosis ([place])   6. Educated about COVID-19 virus infection    PLAN:    In order of problems listed above:  1. He does not have an aneurysm. CT scan performed in 19 revealed an aortic diameter of 3.7 cm. 2. Lipids are excellent. LDL less than 70 is the target and is being achieved on low-dose Crestor 5 mg/day 3. Hemoglobin A1c was 6.7. Encouraged exercise and diet. 4. Unchanged from prior tracing. 5. Primary prevention was discussed. Please see below. 6. COVID-19 vaccine has been received. Social distancing is still being practiced.  Overall education and awareness concerning primary/secondary risk prevention was discussed in detail: LDL less than 70, hemoglobin A1c less than 7, blood pressure target less than 130/80 mmHg, >150 minutes of moderate aerobic activity per week, avoidance of smoking, weight control (via diet and exercise), and continued surveillance/management of/for  obstructive sleep apnea.    Medication Adjustments/Labs and Tests Ordered: Current medicines are reviewed at length with the patient today.  Concerns regarding medicines are outlined above.  Orders Placed This Encounter  Procedures  . EKG 12-Lead   No orders of the defined types were placed in this encounter.   Patient Instructions  Medication Instructions:  Your physician recommends that you continue on your current medications as directed. Please refer to the Current Medication list given to you today.  *If you need a refill on your cardiac medications before your next appointment, please call your pharmacy*   Lab Work: None If you have labs (blood work) drawn today and your tests are completely normal, you will receive your results only by: Marland Kitchen MyChart Message (if you have MyChart) OR . A paper copy in the mail If you have any lab test that is abnormal or we need to change your treatment, we will call you to review the results.   Testing/Procedures: None   Follow-Up: At [facility], you and your health needs are our priority.  As part of our continuing mission to provide you with exceptional heart care, we have created designated Provider Care Teams.  These Care Teams include your primary Cardiologist (physician) and Advanced Practice Providers (APPs -  Physician Assistants and Nurse Practitioners) who all work together to provide you with the care you need, when you need it.  We recommend signing up for the patient portal called "MyChart".  Sign up information is provided on this After Visit Summary.  MyChart is used to connect with patients for Virtual Visits (Telemedicine).  Patients are able to view lab/test results, encounter notes, upcoming appointments, etc.  Non-urgent messages can be sent to your provider as well.   To learn more about what you can do with MyChart, go to [email or web address].    Your next appointment:   12 month(s)  The format for your next  appointment:   In Person  Provider:   You may see Sinclair Grooms, MD or one of the following Advanced Practice Providers on your designated Care Team:    Truitt Merle, NP  Cecilie Kicks, NP  Kathyrn Drown, NP    Other Instructions  Signed, Lesleigh Noe, MD  01/30/2020 2:33 PM    [facility]

## 2020-01-30 ENCOUNTER — Ambulatory Visit (INDEPENDENT_AMBULATORY_CARE_PROVIDER_SITE_OTHER): Payer: Medicare Other | Admitting: Interventional Cardiology

## 2020-01-30 ENCOUNTER — Other Ambulatory Visit: Payer: Self-pay

## 2020-01-30 ENCOUNTER — Encounter: Payer: Self-pay | Admitting: Interventional Cardiology

## 2020-01-30 VITALS — BP 110/66 | HR 88 | Ht 73.0 in | Wt 171.8 lb

## 2020-01-30 DIAGNOSIS — I4510 Unspecified right bundle-branch block: Secondary | ICD-10-CM | POA: Diagnosis not present

## 2020-01-30 DIAGNOSIS — I700 Atherosclerosis of aorta: Secondary | ICD-10-CM

## 2020-01-30 DIAGNOSIS — E119 Type 2 diabetes mellitus without complications: Secondary | ICD-10-CM

## 2020-01-30 DIAGNOSIS — E782 Mixed hyperlipidemia: Secondary | ICD-10-CM | POA: Diagnosis not present

## 2020-01-30 DIAGNOSIS — I7121 Aneurysm of the ascending aorta, without rupture: Secondary | ICD-10-CM

## 2020-01-30 DIAGNOSIS — I719 Aortic aneurysm of unspecified site, without rupture: Secondary | ICD-10-CM | POA: Diagnosis not present

## 2020-01-30 DIAGNOSIS — Z7189 Other specified counseling: Secondary | ICD-10-CM

## 2020-01-30 NOTE — Patient Instructions (Signed)

## 2021-02-28 ENCOUNTER — Ambulatory Visit: Payer: Medicare Other | Admitting: Interventional Cardiology

## 2021-05-04 NOTE — Progress Notes (Signed)
Cardiology Office Note:    Date:  05/05/2021   ID:  Levada Schilling, DOB 05/10/1951, MRN [id number]  PCP:  Herma Carson, MD  Cardiologist:  Lesleigh Noe, MD   Referring MD: Herma Carson, MD   Chief Complaint  Patient presents with   Coronary Artery Disease    History of Present Illness:    Scott Nichols is a 70 y.o. male with a hx of diabetes mellitus type 2, family history of CAD, family history of aortic dissection, personal history of right bundle branch block, and previous documentation of dilated aorta on echocardiography 2016--> chest CT demonstrated maximum aortic diameter of 3.7 cm 2019.  He is doing well.  No symptoms.  Physically very active.  No limitations.  No angina.  No neurological complaints.  Past Medical History:  Diagnosis Date   Back pain    back problems - herniated disc   Chicken pox    Chronic pain    Diabetes mellitus without complication (HCC)    Enlarged prostate    GERD (gastroesophageal reflux disease)    H/O colonoscopy    High cholesterol    Hyperlipidemia    Measles    Mumps    Neck injury    during [place] War   Vision problems     Past Surgical History:  Procedure Laterality Date   ADENOIDECTOMY     BACK SURGERY     COLONOSCOPY  01/20/2012   Normal   GALLBLADDER SURGERY     SPINE SURGERY     c-spine discectomy   TONSILLECTOMY      Current Medications: Current Meds  Medication Sig   citalopram (CELEXA) 20 MG tablet Take 20 mg by mouth daily.   Flaxseed, Linseed, (FLAXSEED OIL) 1000 MG CAPS Take 4,000 mg by mouth daily.    glipiZIDE (GLUCOTROL XL) 5 MG 24 hr tablet Take 5 mg by mouth daily.   metFORMIN (GLUCOPHAGE) 500 MG tablet Take 1,000 mg by mouth 2 (two) times daily.   rosuvastatin (CRESTOR) 5 MG tablet Take 5 mg by mouth daily.   sildenafil (REVATIO) 20 MG tablet Take 80 mg by mouth as needed.   vitamin B-12 (CYANOCOBALAMIN) 500 MCG tablet Take 500 mcg by mouth daily.   Vitamin D, Ergocalciferol, (DRISDOL) 1.25  MG (50000 UNIT) CAPS capsule Take 50,000 Units by mouth every 7 (seven) days.     Allergies:   Metformin and Zocor [simvastatin]   Social History   Socioeconomic History   Marital status: Married    Spouse name: Not on file   Number of children: Not on file   Years of education: Not on file   Highest education level: Not on file  Occupational History   Not on file  Tobacco Use   Smoking status: Never   Smokeless tobacco: Never  Vaping Use   Vaping Use: Never used  Substance and Sexual Activity   Alcohol use: No   Drug use: No   Sexual activity: Not on file  Other Topics Concern   Not on file  Social History Narrative   Not on file   Social Determinants of Health   Financial Resource Strain: Not on file  Food Insecurity: Not on file  Transportation Needs: Not on file  Physical Activity: Not on file  Stress: Not on file  Social Connections: Not on file     Family History: The patient's family history includes Cancer - Prostate in his father; Congestive Heart Failure in his mother; Diabetes in his  father and mother; Heart disease in his mother; Other (age of onset: 6) in his brother.  ROS:   Please see the history of present illness.    Does have some difficulty with arthritis in some of his joints but otherwise no problems.  All other systems reviewed and are negative.  EKGs/Labs/Other Studies Reviewed:    The following studies were reviewed today: LDL cholesterol January 2022 was 78 Hemoglobin A1c 7.4, 11/2020  CT scan 2019: IMPRESSION: No acute finding.   Greatest diameter of the ascending thoracic aorta measures 3.7 cm. There is minimal atherosclerotic change of the thoracic aorta.   There is a 1 cm nodule at the periphery of the right lower lobe, with associated adjacent tree-in-bud nodularity. Consider one of the following in 3 months for both low-risk and high-risk individuals: (a) repeat chest CT, (b) follow-up PET-CT, or (c) tissue sampling. This  recommendation follows the consensus statement: Guidelines for Management of Incidental Pulmonary Nodules Detected on CT Images: From the Fleischner Society 2017; Radiology 2017; 284:228-243.   Low-density lesion of the anterior mediastinum, 2.3 cm. Most likely the lesion is associated with thymic tissue, potentially a thymic cyst or thymoma. Less likely, this could represent adenopathy if the lesion of the right lower lobe proved to be malignant. Attention on future surveillance imaging is recommended, with or without referral for CT surgery evaluation.   Single phase imaging of the liver demonstrates 2 separate hyperenhancing regions. Although most likely benign vascular lesions, these are not completely characterized on the current CT. Further imaging assessment may be warranted, such as contrast-enhanced CT or MRI.  EKG:  EKG normal sinus rhythm, incomplete right bundle branch block, biatrial abnormality.  EKG from 01/27/2020 now reveals incomplete right bundle rather than complete.  Recent Labs: No results found for requested labs within last 8760 hours.  Recent Lipid Panel No results found for: CHOL, TRIG, HDL, CHOLHDL, VLDL, LDLCALC, LDLDIRECT  Physical Exam:    VS:  BP 118/64   Pulse 81   Ht 6\' 1"  (1.854 m)   Wt 166 lb 3.2 oz (75.4 kg)   SpO2 97%   BMI 21.93 kg/m     Wt Readings from Last 3 Encounters:  05/05/21 166 lb 3.2 oz (75.4 kg)  01/30/20 171 lb 12.8 oz (77.9 kg)  12/02/18 183 lb 9.6 oz (83.3 kg)     GEN: Slender and healthy appearing. No acute distress HEENT: Normal NECK: No JVD. LYMPHATICS: No lymphadenopathy CARDIAC: No murmur. RRR no gallop, or edema. VASCULAR:  Normal Pulses. No bruits. RESPIRATORY:  Clear to auscultation without rales, wheezing or rhonchi  ABDOMEN: Soft, non-tender, non-distended, No pulsatile mass, MUSCULOSKELETAL: No deformity  SKIN: Warm and dry NEUROLOGIC:  Alert and oriented x 3 PSYCHIATRIC:  Normal affect   ASSESSMENT:     1. Aortic atherosclerosis (HCC)   2. Aneurysm of aortic root (HCC)   3. Mixed hyperlipidemia   4. Type 2 diabetes mellitus without complication, without long-term current use of insulin (HCC)   5. Right bundle branch block    PLAN:    In order of problems listed above:  Secondary prevention discussed. In another year or so should probably have a repeat CT to reassess aorta which was often normal in 2019. LDL target l was 78 recently continue Crestor 5 mg/day.  A1c is being followed Consider SGLT2 therapy Today's EKG demonstrates incomplete right bundle  Clinical follow-up in 1 year for risk management reassessment   Medication Adjustments/Labs and Tests Ordered: Current medicines  are reviewed at length with the patient today.  Concerns regarding medicines are outlined above.  Orders Placed This Encounter  Procedures   EKG 12-Lead   No orders of the defined types were placed in this encounter.   There are no Patient Instructions on file for this visit.   Signed, Lesleigh Noe, MD  05/05/2021 2:11 PM    [facility]

## 2021-05-05 ENCOUNTER — Ambulatory Visit (INDEPENDENT_AMBULATORY_CARE_PROVIDER_SITE_OTHER): Payer: Medicare Other | Admitting: Interventional Cardiology

## 2021-05-05 ENCOUNTER — Other Ambulatory Visit: Payer: Self-pay

## 2021-05-05 ENCOUNTER — Encounter: Payer: Self-pay | Admitting: Interventional Cardiology

## 2021-05-05 VITALS — BP 118/64 | HR 81 | Ht 73.0 in | Wt 166.2 lb

## 2021-05-05 DIAGNOSIS — E782 Mixed hyperlipidemia: Secondary | ICD-10-CM

## 2021-05-05 DIAGNOSIS — I700 Atherosclerosis of aorta: Secondary | ICD-10-CM

## 2021-05-05 DIAGNOSIS — I4510 Unspecified right bundle-branch block: Secondary | ICD-10-CM

## 2021-05-05 DIAGNOSIS — I719 Aortic aneurysm of unspecified site, without rupture: Secondary | ICD-10-CM | POA: Diagnosis not present

## 2021-05-05 DIAGNOSIS — E119 Type 2 diabetes mellitus without complications: Secondary | ICD-10-CM | POA: Diagnosis not present

## 2021-05-05 DIAGNOSIS — I7121 Aneurysm of the ascending aorta, without rupture: Secondary | ICD-10-CM

## 2021-05-05 NOTE — Patient Instructions (Signed)
Medication Instructions:  Your physician recommends that you continue on your current medications as directed. Please refer to the Current Medication list given to you today.  *If you need a refill on your cardiac medications before your next appointment, please call your pharmacy*   Lab Work: None If you have labs (blood work) drawn today and your tests are completely normal, you will receive your results only by: MyChart Message (if you have MyChart) OR A paper copy in the mail If you have any lab test that is abnormal or we need to change your treatment, we will call you to review the results.   Testing/Procedures: None   Follow-Up: At [facility], you and your health needs are our priority.  As part of our continuing mission to provide you with exceptional heart care, we have created designated Provider Care Teams.  These Care Teams include your primary Cardiologist (physician) and Advanced Practice Providers (APPs -  Physician Assistants and Nurse Practitioners) who all work together to provide you with the care you need, when you need it.  We recommend signing up for the patient portal called "MyChart".  Sign up information is provided on this After Visit Summary.  MyChart is used to connect with patients for Virtual Visits (Telemedicine).  Patients are able to view lab/test results, encounter notes, upcoming appointments, etc.  Non-urgent messages can be sent to your provider as well.   To learn more about what you can do with MyChart, go to [email or web address].    Your next appointment:   1 year(s)  The format for your next appointment:   In Person  Provider:   You may see Mahesh Chandrasekhar, MD or one of the following Advanced Practice Providers on your designated Care Team:   Dayna Dunn, PA-C Michele Lenze, PA-C   Other Instructions   

## 2021-05-05 NOTE — Addendum Note (Signed)
Addended by: Julio Sicks on: 05/05/2021 04:01 PM   Modules accepted: Orders

## 2022-05-01 NOTE — Progress Notes (Signed)
Cardiology Office Note:    Date:  05/04/2022   ID:  Scott Nichols, DOB 08-Apr-1951, MRN [id number]  PCP:  Herma Carson, MD  Cardiologist:  Lesleigh Noe, MD   Referring MD: Herma Carson, MD   Chief Complaint  Patient presents with   Hyperlipidemia   Follow-up    History of Present Illness:    Scott Nichols is a 71 y.o. male with a hx of diabetes mellitus type 2, family history of CAD, family history of aortic dissection, personal history of right bundle branch block, and previous documentation of dilated aorta on echocardiography 2016--> chest CT demonstrated maximum aortic diameter of 3.7 cm 2019.   No cardiac limitations.  Denies dyspnea, orthopnea, angina, edema, palpitations, and syncope.  Has been somewhat limited by lower back pain.  Past Medical History:  Diagnosis Date   Back pain    back problems - herniated disc   Chicken pox    Chronic pain    Diabetes mellitus without complication (HCC)    Enlarged prostate    GERD (gastroesophageal reflux disease)    H/O colonoscopy    High cholesterol    Hyperlipidemia    Measles    Mumps    Neck injury    during [place] War   Vision problems     Past Surgical History:  Procedure Laterality Date   ADENOIDECTOMY     BACK SURGERY     COLONOSCOPY  01/20/2012   Normal   GALLBLADDER SURGERY     SPINE SURGERY     c-spine discectomy   TONSILLECTOMY      Current Medications: Current Meds  Medication Sig   ALPRAZolam (XANAX) 0.25 MG tablet Take 0.25 mg by mouth daily as needed.   Cholecalciferol 25 MCG (1000 UT) tablet Take 1,000 Units by mouth daily.   citalopram (CELEXA) 20 MG tablet Take 40 mg by mouth daily.   Flaxseed, Linseed, (FLAXSEED OIL) 1000 MG CAPS Take 4,000 mg by mouth daily.    glipiZIDE (GLUCOTROL XL) 5 MG 24 hr tablet Take 5 mg by mouth daily.   glucose blood (ONETOUCH ULTRA) test strip Use to check BS. E11.9   metFORMIN (GLUCOPHAGE) 500 MG tablet Take 500 mg by mouth 2 (two) times daily.    polyethylene glycol (MIRALAX / GLYCOLAX) 17 g packet Take by mouth daily.   rosuvastatin (CRESTOR) 5 MG tablet Take 5 mg by mouth daily.   tamsulosin (FLOMAX) 0.4 MG CAPS capsule Take 0.4 mg by mouth.   traZODone (DESYREL) 50 MG tablet Take 50 mg by mouth at bedtime.   vitamin B-12 (CYANOCOBALAMIN) 500 MCG tablet Take 500 mcg by mouth 3 (three) times a week.     Allergies:   Metformin, Zocor [simvastatin], Buspirone, and Tamsulosin   Social History   Socioeconomic History   Marital status: Married    Spouse name: Not on file   Number of children: Not on file   Years of education: Not on file   Highest education level: Not on file  Occupational History   Not on file  Tobacco Use   Smoking status: Never   Smokeless tobacco: Never  Vaping Use   Vaping Use: Never used  Substance and Sexual Activity   Alcohol use: No   Drug use: No   Sexual activity: Not on file  Other Topics Concern   Not on file  Social History Narrative   Not on file   Social Determinants of Health   Financial Resource Strain: Not on  file  Food Insecurity: Not on file  Transportation Needs: Not on file  Physical Activity: Not on file  Stress: Not on file  Social Connections: Not on file     Family History: The patient's family history includes Cancer - Prostate in his father; Congestive Heart Failure in his mother; Diabetes in his father and mother; Heart disease in his mother; Other (age of onset: 51) in his brother.  ROS:   Please see the history of present illness.    Losing weight.  Appetite is good.  All other systems reviewed and are negative.  EKGs/Labs/Other Studies Reviewed:    The following studies were reviewed today: Recent laboratory data 2023 March through June: Creatinine 1.01 Hemoglobin A1c 7.0 EKG:  EKG right bundle branch block, normal sinus rhythm, right atrial abnormality, and vertical axis..  Compared to the prior tracing of June 2022, incomplete right bundle branch block has  progressed to right bundle branch block on this study.  Recent Labs: No results found for requested labs within last 365 days.  Recent Lipid Panel No results found for: "CHOL", "TRIG", "HDL", "CHOLHDL", "VLDL", "LDLCALC", "LDLDIRECT"  Physical Exam:    VS:  BP 118/68   Pulse 69   Ht 6\' 1"  (1.854 m)   Wt 140 lb 6.4 oz (63.7 kg)   SpO2 99%   BMI 18.52 kg/m     Wt Readings from Last 3 Encounters:  05/04/22 140 lb 6.4 oz (63.7 kg)  05/05/21 166 lb 3.2 oz (75.4 kg)  01/30/20 171 lb 12.8 oz (77.9 kg)     GEN: Slender.  Over 2 years weight is down 31 pounds.. No acute distress HEENT: Normal NECK: No JVD. LYMPHATICS: No lymphadenopathy CARDIAC: No murmur. RRR no gallop, or edema. VASCULAR:  Normal Pulses. No bruits. RESPIRATORY:  Clear to auscultation without rales, wheezing or rhonchi  ABDOMEN: Soft, non-tender, non-distended, No pulsatile mass, MUSCULOSKELETAL: No deformity  SKIN: Warm and dry NEUROLOGIC:  Alert and oriented x 3 PSYCHIATRIC:  Normal affect   ASSESSMENT:    1. Aortic atherosclerosis (HCC)   2. Mixed hyperlipidemia   3. Type 2 diabetes mellitus without complication, without long-term current use of insulin (HCC)   4. Right bundle branch block    PLAN:    In order of problems listed above:  Secondary prevention Target LDL less than 70 Target hemoglobin A1c less than 7.  He is on Glucophage and Glucotrol. We will get a 2D Doppler echocardiogram to assess LV function.   Overall education and awareness concerning secondary risk prevention was discussed in detail: LDL less than 70, hemoglobin A1c less than 7, blood pressure target less than 130/80 mmHg, >150 minutes of moderate aerobic activity per week, avoidance of smoking, weight control (via diet and exercise), and continued surveillance/management of/for obstructive sleep apnea.    Medication Adjustments/Labs and Tests Ordered: Current medicines are reviewed at length with the patient today.   Concerns regarding medicines are outlined above.  Orders Placed This Encounter  Procedures   EKG 12-Lead   No orders of the defined types were placed in this encounter.   There are no Patient Instructions on file for this visit.   Signed, 02/01/20, MD  05/04/2022 10:06 AM    [facility]

## 2022-05-04 ENCOUNTER — Ambulatory Visit (INDEPENDENT_AMBULATORY_CARE_PROVIDER_SITE_OTHER): Payer: Medicare Other | Admitting: Interventional Cardiology

## 2022-05-04 ENCOUNTER — Encounter: Payer: Self-pay | Admitting: Interventional Cardiology

## 2022-05-04 VITALS — BP 118/68 | HR 69 | Ht 73.0 in | Wt 140.4 lb

## 2022-05-04 DIAGNOSIS — E782 Mixed hyperlipidemia: Secondary | ICD-10-CM

## 2022-05-04 DIAGNOSIS — I4510 Unspecified right bundle-branch block: Secondary | ICD-10-CM | POA: Diagnosis not present

## 2022-05-04 DIAGNOSIS — E119 Type 2 diabetes mellitus without complications: Secondary | ICD-10-CM | POA: Diagnosis not present

## 2022-05-04 DIAGNOSIS — I700 Atherosclerosis of aorta: Secondary | ICD-10-CM | POA: Diagnosis not present

## 2022-05-04 NOTE — Patient Instructions (Signed)
Medication Instructions:  ?Your physician recommends that you continue on your current medications as directed. Please refer to the Current Medication list given to you today. ? ?*If you need a refill on your cardiac medications before your next appointment, please call your pharmacy* ? ?Lab Work: ?NONE ? ?Testing/Procedures: ?Your physician has requested that you have an echocardiogram. Echocardiography is a painless test that uses sound waves to create images of your heart. It provides your doctor with information about the size and shape of your heart and how well your heart?s chambers and valves are working. This procedure takes approximately one hour. There are no restrictions for this procedure.  ? ?Follow-Up: ?At [facility], you and your health needs are our priority.  As part of our continuing mission to provide you with exceptional heart care, we have created designated Provider Care Teams.  These Care Teams include your primary Cardiologist (physician) and Advanced Practice Providers (APPs -  Physician Assistants and Nurse Practitioners) who all work together to provide you with the care you need, when you need it. ? ?Your next appointment:   ?1 year(s) ? ?The format for your next appointment:   ?In Person ? ?Provider:   ?Henry W Smith III, MD  ? ? ?Important Information About Sugar ? ? ? ? ?  ?

## 2022-05-25 ENCOUNTER — Ambulatory Visit (HOSPITAL_COMMUNITY): Payer: Medicare Other | Attending: Internal Medicine

## 2022-05-25 DIAGNOSIS — I4510 Unspecified right bundle-branch block: Secondary | ICD-10-CM | POA: Insufficient documentation

## 2022-05-25 LAB — ECHOCARDIOGRAM COMPLETE
Area-P 1/2: 2.59 cm2
P 1/2 time: 675 msec
S' Lateral: 3 cm

## 2022-05-29 ENCOUNTER — Telehealth: Payer: Self-pay | Admitting: Interventional Cardiology

## 2022-05-29 NOTE — Telephone Encounter (Signed)
Returned call to patient and discussed echo results.  Per Dr. Katrinka Blazing: Let the patient know the left heart is stiff and squeezes normal. The right heart is mildly weak. No specific action required.If no SOB, no action required.  Patient states he does not have any SOB, he states he "feels great." Patient expressed appreciation for call.

## 2022-05-29 NOTE — Telephone Encounter (Signed)
Pt is calling back in regards to his echo results. Requesting call back.

## 2022-08-24 ENCOUNTER — Telehealth: Payer: Self-pay | Admitting: Interventional Cardiology

## 2022-08-24 NOTE — Progress Notes (Deleted)
Cardiology Office Note:    Date:  08/24/2022   ID:  Scott Nichols, DOB [birth year], MRN [id number]  PCP:  Scott Nichols, [place] [facility] Cardiologist:  Scott Grooms, MD {   Referring MD: Scott Sauer, MD    History of Present Illness:    Scott Nichols is a 71 y.o. male with a hx of DMII, family history of CAD, family history of aortic dissection, RBBB, and HLD who is followed by Dr. Tamala Julian who presents to clinic who presents as urgent visit for chest burning.   Last seen in clinic by Dr. Tamala Julian on 05/04/22 where he was doing well from CV standpoint.  Called clinic on 08/24/22 for episodes of intermittent chest burning that has been ongoing for 2 weeks prompting visit today.  Today, ***  Past Medical History:  Diagnosis Date   Back pain    back problems - herniated disc   Chicken pox    Chronic pain    Diabetes mellitus without complication (HCC)    Enlarged prostate    GERD (gastroesophageal reflux disease)    H/O colonoscopy    High cholesterol    Hyperlipidemia    Measles    Mumps    Neck injury    during [place] War   Vision problems     Past Surgical History:  Procedure Laterality Date   ADENOIDECTOMY     BACK SURGERY     COLONOSCOPY  01/20/2012   Normal   GALLBLADDER SURGERY     SPINE SURGERY     c-spine discectomy   TONSILLECTOMY      Current Medications: No outpatient medications have been marked as taking for the 08/25/22 encounter (Appointment) with Freada Bergeron, MD.     Allergies:   Metformin, Zocor [simvastatin], Buspirone, and Tamsulosin   Social History   Socioeconomic History   Marital status: Married    Spouse name: Not on file   Number of children: Not on file   Years of education: Not on file   Highest education level: Not on file  Occupational History   Not on file  Tobacco Use   Smoking status: Never   Smokeless tobacco: Never  Vaping Use   Vaping Use: Never used  Substance and Sexual  Activity   Alcohol use: No   Drug use: No   Sexual activity: Not on file  Other Topics Concern   Not on file  Social History Narrative   Not on file   Social Determinants of Health   Financial Resource Strain: Not on file  Food Insecurity: Not on file  Transportation Needs: Not on file  Physical Activity: Not on file  Stress: Not on file  Social Connections: Not on file     Family History: The patient's ***family history includes Cancer - Prostate in his father; Congestive Heart Failure in his mother; Diabetes in his father and mother; Heart disease in his mother; Other (age of onset: 72) in his brother.  ROS:   Please see the history of present illness.    *** All other systems reviewed and are negative.  EKGs/Labs/Other Studies Reviewed:    The following studies were reviewed today: TTE 06-23-2022: IMPRESSIONS     1. Left ventricular ejection fraction, by estimation, is 60 to 65%. The  left ventricle has normal function. The left ventricle has no regional  wall motion abnormalities. Left ventricular diastolic parameters are  consistent with Grade I diastolic  dysfunction (impaired relaxation).  2. Right ventricular systolic function is mildly reduced. The right  ventricular size is normal. Tricuspid regurgitation signal is inadequate  for assessing PA pressure.   3. The mitral valve is grossly normal. Trivial mitral valve  regurgitation.   4. The aortic valve is tricuspid. Aortic valve regurgitation is trivial.  Aortic valve sclerosis is present, with no evidence of aortic valve  stenosis.   5. The inferior vena cava is normal in size with greater than 50%  respiratory variability, suggesting right atrial pressure of 3 mmHg.   Comparison(s): No prior Echocardiogram.   EKG:  EKG is *** ordered today.  The ekg ordered today demonstrates ***  Recent Labs: No results found for requested labs within last 365 days.  Recent Lipid Panel No results found for: "CHOL",  "TRIG", "HDL", "CHOLHDL", "VLDL", "LDLCALC", "LDLDIRECT"   Risk Assessment/Calculations:   {Does this patient have ATRIAL FIBRILLATION?:[phone]}  No BP recorded.  {Refresh Note OR Click here to enter BP  :1}***         Physical Exam:    VS:  There were no vitals taken for this visit.    Wt Readings from Last 3 Encounters:  05/04/22 140 lb 6.4 oz (63.7 kg)  05/05/21 166 lb 3.2 oz (75.4 kg)  01/30/20 171 lb 12.8 oz (77.9 kg)     GEN: *** Well nourished, well developed in no acute distress HEENT: Normal NECK: No JVD; No carotid bruits LYMPHATICS: No lymphadenopathy CARDIAC: ***RRR, no murmurs, rubs, gallops RESPIRATORY:  Clear to auscultation without rales, wheezing or rhonchi  ABDOMEN: Soft, non-tender, non-distended MUSCULOSKELETAL:  No edema; No deformity  SKIN: Warm and dry NEUROLOGIC:  Alert and oriented x 3 PSYCHIATRIC:  Normal affect   ASSESSMENT:    No diagnosis found. PLAN:    In order of problems listed above:  #Chest Burning: Has been having intermittent chest burning over the past 2 weeks. States ***  #HLD: -Continue crestor 5mg  daily      {Are you ordering a CV Procedure (e.g. stress test, cath, DCCV, TEE, etc)?   Press F2        :    Medication Adjustments/Labs and Tests Ordered: Current medicines are reviewed at length with the patient today.  Concerns regarding medicines are outlined above.  No orders of the defined types were placed in this encounter.  No orders of the defined types were placed in this encounter.   There are no Patient Instructions on file for this visit.   Signed, [id number]}, MD  08/24/2022 7:47 PM    [facility]

## 2022-08-24 NOTE — Telephone Encounter (Signed)
Pt c/o of Chest Pain: STAT if CP now or developed within 24 hours  1. Are you having CP right now? No  2. Are you experiencing any other symptoms (ex. SOB, nausea, vomiting, sweating)? No  3. How long have you been experiencing CP?  2 weeks  4. Is your CP continuous or coming and going?  Come and goes  5. Have you taken Nitroglycerin? No ? Pt states that he has been having a burning pain that leads from his chest to his back. Pt is schedule to com in office on 10/24 to discuss this issue

## 2022-08-24 NOTE — Telephone Encounter (Signed)
Called patient back about message. Patient stated he is having a burning sensation in his left chest that comes and goes. Made patient an appointment with DOD tomorrow to get evaluated. Patient verbalized understanding.

## 2022-08-25 ENCOUNTER — Ambulatory Visit (HOSPITAL_COMMUNITY): Admit: 2022-08-25 | Payer: Medicare Other | Admitting: Cardiology

## 2022-08-25 ENCOUNTER — Emergency Department (HOSPITAL_COMMUNITY): Payer: Medicare Other

## 2022-08-25 ENCOUNTER — Ambulatory Visit (HOSPITAL_COMMUNITY)
Admission: EM | Admit: 2022-08-25 | Discharge: 2022-08-25 | Disposition: A | Discharge status: Home / Self Care | Payer: Medicare Other | Attending: Emergency Medicine | Admitting: Emergency Medicine

## 2022-08-25 ENCOUNTER — Encounter (HOSPITAL_COMMUNITY): Admission: EM | Disposition: A | Discharge status: Home / Self Care | Payer: Self-pay | Source: Home / Self Care

## 2022-08-25 ENCOUNTER — Ambulatory Visit (INDEPENDENT_AMBULATORY_CARE_PROVIDER_SITE_OTHER): Payer: Medicare Other | Admitting: Cardiology

## 2022-08-25 ENCOUNTER — Encounter: Payer: Self-pay | Admitting: Cardiology

## 2022-08-25 VITALS — BP 100/74 | HR 83 | Ht 73.0 in | Wt 152.0 lb

## 2022-08-25 DIAGNOSIS — Z8249 Family history of ischemic heart disease and other diseases of the circulatory system: Secondary | ICD-10-CM | POA: Insufficient documentation

## 2022-08-25 DIAGNOSIS — R9431 Abnormal electrocardiogram [ECG] [EKG]: Secondary | ICD-10-CM

## 2022-08-25 DIAGNOSIS — I2510 Atherosclerotic heart disease of native coronary artery without angina pectoris: Secondary | ICD-10-CM | POA: Diagnosis not present

## 2022-08-25 DIAGNOSIS — E119 Type 2 diabetes mellitus without complications: Secondary | ICD-10-CM | POA: Diagnosis not present

## 2022-08-25 DIAGNOSIS — I200 Unstable angina: Secondary | ICD-10-CM

## 2022-08-25 DIAGNOSIS — Z79899 Other long term (current) drug therapy: Secondary | ICD-10-CM | POA: Diagnosis not present

## 2022-08-25 DIAGNOSIS — E782 Mixed hyperlipidemia: Secondary | ICD-10-CM | POA: Insufficient documentation

## 2022-08-25 DIAGNOSIS — Z7984 Long term (current) use of oral hypoglycemic drugs: Secondary | ICD-10-CM | POA: Insufficient documentation

## 2022-08-25 DIAGNOSIS — R079 Chest pain, unspecified: Secondary | ICD-10-CM

## 2022-08-25 DIAGNOSIS — I4510 Unspecified right bundle-branch block: Secondary | ICD-10-CM | POA: Diagnosis not present

## 2022-08-25 HISTORY — PX: LEFT HEART CATH AND CORONARY ANGIOGRAPHY: CATH118249

## 2022-08-25 LAB — COMPREHENSIVE METABOLIC PANEL
ALT: 18 U/L (ref 0–44)
AST: 19 U/L (ref 15–41)
Albumin: 4.3 g/dL (ref 3.5–5.0)
Alkaline Phosphatase: 95 U/L (ref 38–126)
Anion gap: 12 (ref 5–15)
BUN: 17 mg/dL (ref 8–23)
CO2: 25 mmol/L (ref 22–32)
Calcium: 10.2 mg/dL (ref 8.9–10.3)
Chloride: 104 mmol/L (ref 98–111)
Creatinine, Ser: 1.02 mg/dL (ref 0.61–1.24)
GFR, Estimated: >60 mL/min (ref >60)
Glucose, Bld: 189 mg/dL — ABNORMAL HIGH (ref 70–99)
Potassium: 4.4 mmol/L (ref 3.5–5.1)
Sodium: 141 mmol/L (ref 135–145)
Total Bilirubin: 0.6 mg/dL (ref 0.3–1.2)
Total Protein: 7 g/dL (ref 6.5–8.1)

## 2022-08-25 LAB — GLUCOSE, CAPILLARY: Glucose-Capillary: 110 mg/dL — ABNORMAL HIGH (ref 70–99)

## 2022-08-25 LAB — CBC
HCT: 51.9 % (ref 39.0–52.0)
Hemoglobin: 18 g/dL — ABNORMAL HIGH (ref 13.0–17.0)
MCH: 32.3 pg (ref 26.0–34.0)
MCHC: 34.7 g/dL (ref 30.0–36.0)
MCV: 93 fL (ref 80.0–100.0)
Platelets: 246 10*3/uL (ref 150–400)
RBC: 5.58 MIL/uL (ref 4.22–5.81)
RDW: 11.9 % (ref 11.5–15.5)
WBC: 5.5 10*3/uL (ref 4.0–10.5)
nRBC: 0 % (ref 0.0–0.2)

## 2022-08-25 LAB — TROPONIN I (HIGH SENSITIVITY): Troponin I (High Sensitivity): 3 ng/L (ref <18)

## 2022-08-25 SURGERY — LEFT HEART CATH AND CORONARY ANGIOGRAPHY
Anesthesia: LOCAL

## 2022-08-25 MED ORDER — VERAPAMIL HCL 2.5 MG/ML IV SOLN
INTRAVENOUS | Status: DC | PRN
  Administered 2022-08-25: 10 mL via INTRA_ARTERIAL

## 2022-08-25 MED ORDER — LIDOCAINE HCL (PF) 1 % IJ SOLN
INTRAMUSCULAR | Status: AC
  Filled 2022-08-25: qty 30

## 2022-08-25 MED ORDER — LIDOCAINE HCL (PF) 1 % IJ SOLN
INTRAMUSCULAR | Status: DC | PRN
  Administered 2022-08-25: 2 mL

## 2022-08-25 MED ORDER — IOHEXOL 350 MG/ML SOLN
INTRAVENOUS | Status: DC | PRN
  Administered 2022-08-25: 40 mL

## 2022-08-25 MED ORDER — HEPARIN SODIUM (PORCINE) 1000 UNIT/ML IJ SOLN
INTRAMUSCULAR | Status: DC | PRN
  Administered 2022-08-25: 3500 [IU] via INTRAVENOUS

## 2022-08-25 MED ORDER — SODIUM CHLORIDE 0.9 % WEIGHT BASED INFUSION: 3.0000 mL/kg/h | INTRAVENOUS | Status: DC

## 2022-08-25 MED ORDER — HEPARIN (PORCINE) IN NACL 1000-0.9 UT/500ML-% IV SOLN
INTRAVENOUS | Status: AC
  Filled 2022-08-25: qty 1000

## 2022-08-25 MED ORDER — SODIUM CHLORIDE 0.9 % WEIGHT BASED INFUSION: 1.0000 mL/kg/h | INTRAVENOUS | Status: DC

## 2022-08-25 MED ORDER — MIDAZOLAM HCL 2 MG/2ML IJ SOLN
INTRAMUSCULAR | Status: AC
  Filled 2022-08-25: qty 2

## 2022-08-25 MED ORDER — ONDANSETRON HCL 4 MG/2ML IJ SOLN: 4.0000 mg | Freq: Four times a day (QID) | INTRAMUSCULAR | Status: DC | PRN

## 2022-08-25 MED ORDER — SODIUM CHLORIDE 0.9 % WEIGHT BASED INFUSION: 3.0000 mL/kg/h | INTRAVENOUS | Status: AC

## 2022-08-25 MED ORDER — ACETAMINOPHEN 325 MG PO TABS: 650.0000 mg | ORAL_TABLET | ORAL | Status: DC | PRN

## 2022-08-25 MED ORDER — ASPIRIN 81 MG PO CHEW: 81.0000 mg | CHEWABLE_TABLET | ORAL | Status: DC

## 2022-08-25 MED ORDER — SODIUM CHLORIDE 0.9% FLUSH: 3.0000 mL | Freq: Two times a day (BID) | INTRAVENOUS | Status: DC

## 2022-08-25 MED ORDER — VERAPAMIL HCL 2.5 MG/ML IV SOLN
INTRAVENOUS | Status: AC
  Filled 2022-08-25: qty 2

## 2022-08-25 MED ORDER — HEPARIN (PORCINE) IN NACL 1000-0.9 UT/500ML-% IV SOLN
INTRAVENOUS | Status: DC | PRN
  Administered 2022-08-25 (×2): 500 mL

## 2022-08-25 MED ORDER — HEPARIN SODIUM (PORCINE) 1000 UNIT/ML IJ SOLN
INTRAMUSCULAR | Status: AC
  Filled 2022-08-25: qty 10

## 2022-08-25 MED ORDER — FENTANYL CITRATE (PF) 100 MCG/2ML IJ SOLN
INTRAMUSCULAR | Status: AC
  Filled 2022-08-25: qty 2

## 2022-08-25 MED ORDER — SODIUM CHLORIDE 0.9% FLUSH: 3.0000 mL | INTRAVENOUS | Status: DC | PRN

## 2022-08-25 MED ORDER — FENTANYL CITRATE (PF) 100 MCG/2ML IJ SOLN
INTRAMUSCULAR | Status: DC | PRN
  Administered 2022-08-25: 25 ug via INTRAVENOUS

## 2022-08-25 MED ORDER — SODIUM CHLORIDE 0.9 % IV SOLN: 250.0000 mL | INTRAVENOUS | Status: DC | PRN

## 2022-08-25 MED ORDER — MIDAZOLAM HCL 2 MG/2ML IJ SOLN
INTRAMUSCULAR | Status: DC | PRN
  Administered 2022-08-25: 1 mg via INTRAVENOUS

## 2022-08-25 SURGICAL SUPPLY — 9 items
BAND ZEPHYR COMPRESS 30 LONG (HEMOSTASIS) IMPLANT
CATH 5FR JL3.5 JR4 ANG PIG MP (CATHETERS) IMPLANT
GLIDESHEATH SLEND SS 6F .021 (SHEATH) IMPLANT
GUIDEWIRE INQWIRE 1.5J.035X260 (WIRE) IMPLANT
INQWIRE 1.5J .035X260CM (WIRE) ×1
KIT HEART LEFT (KITS) ×1 IMPLANT
PACK CARDIAC CATHETERIZATION (CUSTOM PROCEDURE TRAY) ×1 IMPLANT
TRANSDUCER W/STOPCOCK (MISCELLANEOUS) ×1 IMPLANT
TUBING CIL FLEX 10 FLL-RA (TUBING) ×1 IMPLANT

## 2022-08-25 NOTE — Patient Instructions (Signed)
Medication Instructions:   Your physician recommends that you continue on your current medications as directed. Please refer to the Current Medication list given to you today.  *If you need a refill on your cardiac medications before your next appointment, please call your pharmacy*     Follow-Up:  YOU WILL BE TRANSPORTED EMS TO [facility] FOR CHEST PAIN AND ABNORMAL EKG  FOLLOW-UP WILL BE [place]

## 2022-08-25 NOTE — ED Notes (Signed)
Pt. To cath lab. 

## 2022-08-25 NOTE — ED Notes (Signed)
Pt. Informed this RN that he is scheduled for a heart Cath today. Education officer, community, he spoke to the lab and they are aware he is here and will come to get him .  Updated pt. And family on plan of care.

## 2022-08-25 NOTE — H&P (Addendum)
Cardiology Office Note:     Date:  08/25/2022    ID:  Scott Nichols, DOB [birth year], MRN [id number]   PCP:  Cathlean Sauer, [employer] Prov[facility] Cardiologist:  Sinclair Grooms, MD {    Referring MD: Cathlean Sauer, MD      History of Present Illness:     Meron Bostrom is a 71 y.o. male with a hx of DMII, family history of CAD, family history of aortic dissection, RBBB, and HLD who is followed by Dr. Tamala Julian who presents to clinic who presents as urgent visit for chest burning.    Last seen in clinic by Dr. Tamala Julian on 05/04/22 where he was doing well from CV standpoint.   Called clinic on 08/24/22 for episodes of intermittent chest burning that has been ongoing for 2 weeks prompting visit today.   Today, patient states that he is suffering from intermitting chest pain that he describes as sharp and burning. It is localized in his central chest, with deep radiation towards his back. His episodes are exacerbated with physical activity such as mowing the lawn or "any stress at all." This has been progressively worsening over the past three weeks. No associated SOB, lightheadedness, dizziness or syncope.   At this visit he is experiencing mild burning sensations in his chest. ECG shows isolated STE in III which is new from prior. We discussed that given his exertional chest pain that is now progressed to symptoms at rest as well as ECG changes, will plan for transfer to [facility] for cath.   He denies any palpitations, shortness of breath, or peripheral edema. No lightheadedness, headaches, syncope, orthopnea, or PND.        Past Medical History:  Diagnosis Date   Back pain      back problems - herniated disc   Chicken pox     Chronic pain     Diabetes mellitus without complication ([place])     Enlarged prostate     GERD (gastroesophageal reflux disease)     H/O colonoscopy     High cholesterol     Hyperlipidemia     Measles     Mumps     Neck injury      during  [place] War   Vision problems             Past Surgical History:  Procedure Laterality Date   ADENOIDECTOMY       BACK SURGERY       COLONOSCOPY   01/20/2012    Normal   GALLBLADDER SURGERY       SPINE SURGERY        c-spine discectomy   TONSILLECTOMY          Current Medications: Active Medications      Current Meds  Medication Sig   ALPRAZolam (XANAX) 0.25 MG tablet Take 0.25 mg by mouth daily as needed.   Cholecalciferol 25 MCG (1000 UT) tablet Take 1,000 Units by mouth daily.   Flaxseed, Linseed, (FLAXSEED OIL) 1000 MG CAPS Take 4,000 mg by mouth daily.    glipiZIDE (GLUCOTROL XL) 5 MG 24 hr tablet Take 5 mg by mouth daily.   glucose blood (ONETOUCH ULTRA) test strip Use to check BS. E11.9   Lancets (ONETOUCH DELICA PLUS 123XX123) MISC 1 Lancet by Other route daily as needed (as needed).   metFORMIN (GLUCOPHAGE) 500 MG tablet Take 500 mg by mouth 2 (  two) times daily.   pantoprazole (PROTONIX) 40 MG tablet Take 40 mg by mouth daily.   polyethylene glycol (MIRALAX / GLYCOLAX) 17 g packet Take by mouth daily.   rosuvastatin (CRESTOR) 5 MG tablet Take 5 mg by mouth daily.   traZODone (DESYREL) 50 MG tablet Take 50 mg by mouth at bedtime.   venlafaxine XR (EFFEXOR-XR) 150 MG 24 hr capsule Take 150-300 mg by mouth at bedtime.   vitamin B-12 (CYANOCOBALAMIN) 500 MCG tablet Take 500 mcg by mouth 3 (three) times a week.        Allergies:   Metformin, Zocor [simvastatin], Buspirone, and Tamsulosin    Social History         Socioeconomic History   Marital status: Married      Spouse name: Not on file   Number of children: Not on file   Years of education: Not on file   Highest education level: Not on file  Occupational History   Not on file  Tobacco Use   Smoking status: Never   Smokeless tobacco: Never  Vaping Use   Vaping Use: Never used  Substance and Sexual Activity   Alcohol use: No   Drug use: No   Sexual activity: Not on file  Other Topics Concern   Not  on file  Social History Narrative   Not on file    Social Determinants of Health    Financial Resource Strain: Not on file  Food Insecurity: Not on file  Transportation Needs: Not on file  Physical Activity: Not on file  Stress: Not on file  Social Connections: Not on file      Family History: The patient's family history includes Cancer - Prostate in his father; Congestive Heart Failure in his mother; Diabetes in his father and mother; Heart disease in his mother; Other (age of onset: 51) in his brother.   ROS:   Review of Systems  Constitutional:  Negative for fever and weight loss.  HENT:  Negative for ear discharge and tinnitus.   Eyes:  Negative for blurred vision and photophobia.  Respiratory:  Negative for hemoptysis and shortness of breath.   Cardiovascular:  Positive for chest pain (Sharp, burning). Negative for palpitations, orthopnea, claudication, leg swelling and PND.  Gastrointestinal:  Negative for heartburn and vomiting.  Genitourinary:  Negative for dysuria and frequency.  Musculoskeletal:  Negative for back pain and joint pain.  Neurological:  Negative for tingling and headaches.  Psychiatric/Behavioral:  Negative for substance abuse and suicidal ideas. The patient is nervous/anxious (Anxiety).     EKGs/Labs/Other Studies Reviewed:     The following studies were reviewed today:   TTE 05/25/22: IMPRESSIONS    1. Left ventricular ejection fraction, by estimation, is 60 to 65%. The  left ventricle has normal function. The left ventricle has no regional  wall motion abnormalities. Left ventricular diastolic parameters are  consistent with Grade I diastolic  dysfunction (impaired relaxation).   2. Right ventricular systolic function is mildly reduced. The right  ventricular size is normal. Tricuspid regurgitation signal is inadequate  for assessing PA pressure.   3. The mitral valve is grossly normal. Trivial mitral valve  regurgitation.   4. The aortic  valve is tricuspid. Aortic valve regurgitation is trivial.  Aortic valve sclerosis is present, with no evidence of aortic valve  stenosis.   5. The inferior vena cava is normal in size with greater than 50%  respiratory variability, suggesting right atrial pressure of 3 mmHg.   Comparison(s):  No prior Echocardiogram.    CT Chest 01/25/22 ([place]):   IMPRESSION:  1. No cause for the patient's weight loss identified.  2. Calcified atherosclerosis in the nonaneurysmal aorta. Calcified  atherosclerotic change of the coronary arteries as above.  3. 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 Other etiologies are  possible. Regardless, 4 years of stability is consistent with a  benign etiology.  4. Stable benign pulmonary nodules and tree-in-bud opacities in the  right lung.   Aortic Atherosclerosis (ICD10-I70.0).    CT Angio Chest Aorta 11/24/2017: IMPRESSION: No acute finding.   Greatest diameter of the ascending thoracic aorta measures 3.7 cm. There is minimal atherosclerotic change of the thoracic aorta.   There is a 1 cm nodule at the periphery of the right lower lobe, with associated adjacent tree-in-bud nodularity. Consider one of the following in 3 months for both low-risk and high-risk individuals: (a) repeat chest CT, (b) follow-up PET-CT, or (c) tissue sampling. This recommendation follows the consensus statement: Guidelines for Management of Incidental Pulmonary Nodules Detected on CT Images: From the Fleischner Society 2017; Radiology 2017; 284:228-243.   Low-density lesion of the anterior mediastinum, 2.3 cm. Most likely the lesion is associated with thymic tissue, potentially a thymic cyst or thymoma. Less likely, this could represent adenopathy if the lesion of the right lower lobe proved to be malignant. Attention on future surveillance imaging is recommended, with or without referral for  CT surgery evaluation.   Single phase imaging of the liver demonstrates 2 separate hyperenhancing regions. Although most likely benign vascular lesions, these are not completely characterized on the current CT. Further imaging assessment may be warranted, such as contrast-enhanced CT or MRI.   EKG:  EKG was personally reviewed. 08/25/22: NSR, RBBB, HR: 79 bpm.   Recent Labs: No results found for requested labs within last 365 days.  Recent Lipid Panel Labs (Brief)  No results found for: "CHOL", "TRIG", "HDL", "CHOLHDL", "VLDL", "LDLCALC", "LDLDIRECT"       Risk Assessment/Calculations:                   Physical Exam:     VS:  BP 100/74   Pulse 83   Ht 6\' 1"  (1.854 m)   Wt 152 lb (68.9 kg)   SpO2 97%   BMI 20.05 kg/m         Wt Readings from Last 3 Encounters:  08/25/22 152 lb (68.9 kg)  05/04/22 140 lb 6.4 oz (63.7 kg)  05/05/21 166 lb 3.2 oz (75.4 kg)      GEN: Well nourished, well developed in no acute distress HEENT: Normal NECK: No JVD; No carotid bruits CARDIAC: RRR, no murmurs, rubs, gallops RESPIRATORY:  Clear to auscultation without rales, wheezing or rhonchi  ABDOMEN: Soft, non-tender, non-distended MUSCULOSKELETAL:  No edema; No deformity  SKIN: Warm and dry NEUROLOGIC:  Alert and oriented x 3 PSYCHIATRIC:  Normal affect    ASSESSMENT:     1. Unstable angina ([place])   2. Nonspecific abnormal electrocardiogram (ECG) (EKG)     PLAN:     In order of problems listed above:   #Chest Pain: #Concern for UA: Patient presents with progressive substernal chest burning radiating to his back that has been occurring with exertion and with stress. Symptoms have improved with sitting and resting. Developed chest burning while in clinic with isolated STE in III which appears different from prior but is  in the setting of RBBB. Given symptoms and mild ECG changes, will plan to transfer to [facility] for cath today. Discussed with the patient at [place]. -Plan for LHC  today -Will give ASA 324mg  with EMS -Will add GDMT as needed pending cath results   #HLD: -On crestor 5mg  daily; will adjust pending cath findings     Shared Decision Making/Informed Consent The risks [stroke (1 in 1000), death (1 in 1000), kidney failure [usually temporary] (1 in 500), bleeding (1 in 200), allergic reaction [possibly serious] (1 in 200)], benefits (diagnostic support and management of coronary artery disease) and alternatives of a cardiac catheterization were discussed in detail with Mr. Schaum and he is willing to proceed.     Patient seen and examined and agree with Reino Bellis, NP as detailed above. Please see progress note from today for further details.   Plan for LHC today.  Gwyndolyn Kaufman, MD

## 2022-08-25 NOTE — Addendum Note (Signed)
Addended by: Gwyndolyn Kaufman on: 08/25/2022 01:21 PM   Modules accepted: Level of Service

## 2022-08-25 NOTE — Discharge Summary (Signed)
Discharge Summary    Patient ID: Scott Nichols MRN: [id number]; DOB: 11-Jun-1951  Admit date: 08/25/2022 Discharge date: 08/25/2022  PCP:  Herma Carson, MD   [facility] Providers Cardiologist:  Lesleigh Noe, MD     Discharge Diagnoses    Active Problems:   Chest pain of uncertain etiology  Diagnostic Studies/Procedures    Cath: 08/25/22    The left ventricular systolic function is normal.   LV end diastolic pressure is normal.   The left ventricular ejection fraction is 55-65% by visual estimate.   Minimal nonobstructive CAD with moderate ectasia Normal LV function Normal LVEDP   Plan: medical management. Anticipate DC same day _____________   History of Present Illness     Scott Nichols is a 71 y.o. male with a hx of DMII, family history of CAD, family history of aortic dissection, RBBB, and HLD who is followed by Dr. Katrinka Blazing who presented to clinic on    Seen in the clinic by Dr. Katrinka Blazing on 05/04/22 where he was doing well from CV standpoint.   Called clinic on 08/24/22 for episodes of intermittent chest burning that had been ongoing for 2 weeks prompting visit.   Presented to the office and seen by Dr. Shari Prows on 10/20 reporting he was suffering from intermitting chest pain that he described as sharp and burning. It is localized in his central chest, with deep radiation towards his back. His episodes are exacerbated with physical activity such as mowing the lawn or "any stress at all." This has been progressively worsening over the past three weeks. No associated SOB, lightheadedness, dizziness or syncope.   At his prior visit he was experiencing mild burning sensations in his chest. ECG shows isolated STE in III which is new from prior. Dr. Shari Prows discussed that given his exertional chest pain that had now progressed to symptoms at rest as well as ECG changes, he was transferred to [facility] for urgently cardiac cath.   He denied any palpitations,  shortness of breath, or peripheral edema. No lightheadedness, headaches, syncope, orthopnea, or PND.   Hospital Course     Underwent cardiac cath noted above with minimal nonobstructive CAD with moderate ectasia. Normal LV function and normal LVEDP. No culprit lesion for chest discomfort. No change in medications. He was transferred to short stay prior to discharge.   Patient was seen by Dr. [place] and deemed stable to discharge home. Follow up arranged in the office.   Did the patient have an acute coronary syndrome (MI, NSTEMI, STEMI, etc) this admission?:  No                               Did the patient have a percutaneous coronary intervention (stent / angioplasty)?:  No.    _____________  Discharge Vitals Blood pressure 136/74, pulse 66, temperature 97.6 F (36.4 C), temperature source Oral, resp. rate 14, SpO2 99 %.  There were no vitals filed for this visit.  Labs & Radiologic Studies    CBC Recent Labs    08/25/22 1030  WBC 5.5  HGB 18.0*  HCT 51.9  MCV 93.0  PLT 246   Basic Metabolic Panel Recent Labs    88/89/16 1030  NA 141  K 4.4  CL 104  CO2 25  GLUCOSE 189*  BUN 17  CREATININE 1.02  CALCIUM 10.2   Liver Function Tests Recent Labs    08/25/22 1030  AST 19  ALT 18  ALKPHOS 95  BILITOT 0.6  PROT 7.0  ALBUMIN 4.3   No results for input(s): "LIPASE", "AMYLASE" in the last 72 hours. High Sensitivity Troponin:   Recent Labs  Lab 08/25/22 1030  TROPONINIHS 3    BNP Invalid input(s): "POCBNP" D-Dimer No results for input(s): "DDIMER" in the last 72 hours. Hemoglobin A1C No results for input(s): "HGBA1C" in the last 72 hours. Fasting Lipid Panel No results for input(s): "CHOL", "HDL", "LDLCALC", "TRIG", "CHOLHDL", "LDLDIRECT" in the last 72 hours. Thyroid Function Tests No results for input(s): "TSH", "T4TOTAL", "T3FREE", "THYROIDAB" in the last 72 hours.  Invalid input(s): "FREET3" _____________  CARDIAC CATHETERIZATION  Result Date:  08/25/2022   The left ventricular systolic function is normal.   LV end diastolic pressure is normal.   The left ventricular ejection fraction is 55-65% by visual estimate. Minimal nonobstructive CAD with moderate ectasia Normal LV function Normal LVEDP Plan: medical management. Anticipate DC same day   DG Chest 1 View  Result Date: 08/25/2022 CLINICAL DATA:  Chest pain EXAM: CHEST  1 VIEW COMPARISON:  Radiograph 04/21/2021 FINDINGS: Unchanged cardiomediastinal silhouette. There is no focal airspace consolidation. Unchanged 1.0 cm pulmonary nodule overlying the periphery of the right lower lung. No pleural effusion. No evidence of pneumothorax. Unchanged left apical pleural calcifications. No acute osseous abnormality. Thoracic spondylosis. IMPRESSION: No evidence of acute cardiopulmonary disease. Unchanged 1.0 cm pulmonary nodule overlying the periphery of the right lower lung, best described on prior chest CTs. Electronically Signed   By: Caprice Renshaw M.D.   On: 08/25/2022 11:11   Disposition   Pt is being discharged home today in good condition.  Follow-up Plans & Appointments     Follow-up Information     Levi Aland, NP Follow up on 09/05/2022.   Specialty: Nurse Practitioner Why: at 10am for your follow up appt Contact information: [address] [place] [address] [phone]                 Discharge Medications   Allergies as of 08/25/2022       Reactions   Metformin Diarrhea, Nausea And Vomiting   "weight loss"   Zocor [simvastatin] Other (See Comments)   Reaction: Arthralgia (joint pain)   Buspirone    Other reaction(s): Delusions (intolerance)   Tamsulosin    Other reaction(s): Dizziness (intolerance)        Medication List     TAKE these medications    ALPRAZolam 0.25 MG tablet Commonly known as: XANAX Take 0.25 mg by mouth daily as needed.   Cholecalciferol 25 MCG (1000 UT) tablet Take 1,000 Units by mouth daily.    citalopram 20 MG tablet Commonly known as: CELEXA Take 40 mg by mouth daily.   cyanocobalamin 500 MCG tablet Commonly known as: VITAMIN B12 Take 500 mcg by mouth 3 (three) times a week.   Flaxseed Oil 1000 MG Caps Take 4,000 mg by mouth daily.   glipiZIDE 5 MG 24 hr tablet Commonly known as: GLUCOTROL XL Take 5 mg by mouth daily.   metFORMIN 500 MG tablet Commonly known as: GLUCOPHAGE Take 500 mg by mouth 2 (two) times daily.   OneTouch Delica Plus Lancet33G Misc 1 Lancet by Other route daily as needed (as needed).   OneTouch Ultra test strip Generic drug: glucose blood Use to check BS. E11.9   pantoprazole 40 MG tablet Commonly known as: PROTONIX Take 40 mg by mouth daily.   polyethylene glycol 17 g packet  Commonly known as: MIRALAX / GLYCOLAX Take by mouth daily.   rosuvastatin 5 MG tablet Commonly known as: CRESTOR Take 5 mg by mouth daily.   traZODone 50 MG tablet Commonly known as: DESYREL Take 50 mg by mouth at bedtime.   venlafaxine XR 150 MG 24 hr capsule Commonly known as: EFFEXOR-XR Take 150-300 mg by mouth at bedtime.         Outstanding Labs/Studies   N/a   Duration of Discharge Encounter   Greater than 30 minutes including physician time.  Signed, Reino Bellis, NP 08/25/2022, 4:28 PM

## 2022-08-25 NOTE — ED Provider Triage Note (Signed)
Emergency Medicine Provider Triage Evaluation Note  Tannor Pyon , a 71 y.o. male  was evaluated in triage.  Pt complains of chest pain.  Patient states that chest pain has been intermittent over the past week and has been related to physical activity.  He was seen by his cardiologist earlier today and told to come to [facility] for cardiac catheterization..  Review of Systems  Positive: See above Negative:   Physical Exam  There were no vitals taken for this visit. Gen:   Awake, no distress   Resp:  Normal effort  MSK:   Moves extremities without difficulty  Other:    Medical Decision Making  Medically screening exam initiated at 10:36 AM.  Appropriate orders placed.  Dennise Bamber was informed that the remainder of the evaluation will be completed by another provider, this initial triage assessment does not replace that evaluation, and the importance of remaining in the ED until their evaluation is complete.  Cardiologist made aware of patient's presence in the ED.   Wilnette Kales, [place] 08/25/22 1040

## 2022-08-25 NOTE — Progress Notes (Signed)
Patient and wife was given discharge instructions. Both verbalized understanding. 

## 2022-08-25 NOTE — Progress Notes (Signed)
1705 took report and care form patient

## 2022-08-25 NOTE — Interval H&P Note (Signed)
History and Physical Interval Note:  08/25/2022 2:36 PM  Scott Nichols  has presented today for surgery, with the diagnosis of chest pain.  The various methods of treatment have been discussed with the patient and family. After consideration of risks, benefits and other options for treatment, the patient has consented to  Procedure(s): LEFT HEART CATH AND CORONARY ANGIOGRAPHY (N/A) as a surgical intervention.  The patient's history has been reviewed, patient examined, no change in status, stable for surgery.  I have reviewed the patient's chart and labs.  Questions were answered to the patient's satisfaction.   Cath Lab Visit (complete for each Cath Lab visit)  Clinical Evaluation Leading to the Procedure:   ACS: Yes.    Non-ACS:    Anginal Classification: CCS III  Anti-ischemic medical therapy: No Therapy  Non-Invasive Test Results: No non-invasive testing performed  Prior CABG: No previous CABG        Collier [facility] 08/25/2022 2:36 PM

## 2022-08-25 NOTE — Progress Notes (Signed)
Cardiology Office Note:    Date:  08/25/2022   ID:  Scott Nichols, DOB [birth year], MRN [id number]  PCP:  Cathlean Sauer, [place] Providers Cardiologist:  Sinclair Grooms, MD {   Referring MD: Cathlean Sauer, MD    History of Present Illness:    Scott Nichols is a 71 y.o. male with a hx of DMII, family history of CAD, family history of aortic dissection, RBBB, and HLD who is followed by Dr. Tamala Julian who presents to clinic who presents as urgent visit for chest burning.   Last seen in clinic by Dr. Tamala Julian on 05/04/22 where he was doing well from CV standpoint.  Called clinic on 08/24/22 for episodes of intermittent chest burning that has been ongoing for 2 weeks prompting visit today.  Today, patient states that he is suffering from intermitting chest pain that he describes as sharp and burning. It is localized in his central chest, with deep radiation towards his back. His episodes are exacerbated with physical activity such as mowing the lawn or "any stress at all." This has been progressively worsening over the past three weeks. No associated SOB, lightheadedness, dizziness or syncope.  At this visit he is experiencing mild burning sensations in his chest. ECG shows isolated STE in III which is new from prior. We discussed that given his exertional chest pain that is now progressed to symptoms at rest as well as ECG changes, will plan for transfer to [facility] for cath.  He denies any palpitations, shortness of breath, or peripheral edema. No lightheadedness, headaches, syncope, orthopnea, or PND.   Past Medical History:  Diagnosis Date   Back pain    back problems - herniated disc   Chicken pox    Chronic pain    Diabetes mellitus without complication (HCC)    Enlarged prostate    GERD (gastroesophageal reflux disease)    H/O colonoscopy    High cholesterol    Hyperlipidemia    Measles    Mumps    Neck injury    during [place] War   Vision problems     Past  Surgical History:  Procedure Laterality Date   ADENOIDECTOMY     BACK SURGERY     COLONOSCOPY  01/20/2012   Normal   GALLBLADDER SURGERY     SPINE SURGERY     c-spine discectomy   TONSILLECTOMY      Current Medications: Current Meds  Medication Sig   ALPRAZolam (XANAX) 0.25 MG tablet Take 0.25 mg by mouth daily as needed.   Cholecalciferol 25 MCG (1000 UT) tablet Take 1,000 Units by mouth daily.   Flaxseed, Linseed, (FLAXSEED OIL) 1000 MG CAPS Take 4,000 mg by mouth daily.    glipiZIDE (GLUCOTROL XL) 5 MG 24 hr tablet Take 5 mg by mouth daily.   glucose blood (ONETOUCH ULTRA) test strip Use to check BS. E11.9   Lancets (ONETOUCH DELICA PLUS 123XX123) MISC 1 Lancet by Other route daily as needed (as needed).   metFORMIN (GLUCOPHAGE) 500 MG tablet Take 500 mg by mouth 2 (two) times daily.   pantoprazole (PROTONIX) 40 MG tablet Take 40 mg by mouth daily.   polyethylene glycol (MIRALAX / GLYCOLAX) 17 g packet Take by mouth daily.   rosuvastatin (CRESTOR) 5 MG tablet Take 5 mg by mouth daily.   traZODone (DESYREL) 50 MG tablet Take 50 mg by mouth at bedtime.   venlafaxine XR (EFFEXOR-XR) 150 MG 24 hr capsule Take 150-300 mg by mouth  at bedtime.   vitamin B-12 (CYANOCOBALAMIN) 500 MCG tablet Take 500 mcg by mouth 3 (three) times a week.     Allergies:   Metformin, Zocor [simvastatin], Buspirone, and Tamsulosin   Social History   Socioeconomic History   Marital status: Married    Spouse name: Not on file   Number of children: Not on file   Years of education: Not on file   Highest education level: Not on file  Occupational History   Not on file  Tobacco Use   Smoking status: Never   Smokeless tobacco: Never  Vaping Use   Vaping Use: Never used  Substance and Sexual Activity   Alcohol use: No   Drug use: No   Sexual activity: Not on file  Other Topics Concern   Not on file  Social History Narrative   Not on file   Social Determinants of Health   Financial Resource  Strain: Not on file  Food Insecurity: Not on file  Transportation Needs: Not on file  Physical Activity: Not on file  Stress: Not on file  Social Connections: Not on file     Family History: The patient's family history includes Cancer - Prostate in his father; Congestive Heart Failure in his mother; Diabetes in his father and mother; Heart disease in his mother; Other (age of onset: 70) in his brother.  ROS:   Review of Systems  Constitutional:  Negative for fever and weight loss.  HENT:  Negative for ear discharge and tinnitus.   Eyes:  Negative for blurred vision and photophobia.  Respiratory:  Negative for hemoptysis and shortness of breath.   Cardiovascular:  Positive for chest pain (Sharp, burning). Negative for palpitations, orthopnea, claudication, leg swelling and PND.  Gastrointestinal:  Negative for heartburn and vomiting.  Genitourinary:  Negative for dysuria and frequency.  Musculoskeletal:  Negative for back pain and joint pain.  Neurological:  Negative for tingling and headaches.  Psychiatric/Behavioral:  Negative for substance abuse and suicidal ideas. The patient is nervous/anxious (Anxiety).     EKGs/Labs/Other Studies Reviewed:    The following studies were reviewed today:  TTE 05/25/22: IMPRESSIONS    1. Left ventricular ejection fraction, by estimation, is 60 to 65%. The  left ventricle has normal function. The left ventricle has no regional  wall motion abnormalities. Left ventricular diastolic parameters are  consistent with Grade I diastolic  dysfunction (impaired relaxation).   2. Right ventricular systolic function is mildly reduced. The right  ventricular size is normal. Tricuspid regurgitation signal is inadequate  for assessing PA pressure.   3. The mitral valve is grossly normal. Trivial mitral valve  regurgitation.   4. The aortic valve is tricuspid. Aortic valve regurgitation is trivial.  Aortic valve sclerosis is present, with no evidence of  aortic valve  stenosis.   5. The inferior vena cava is normal in size with greater than 50%  respiratory variability, suggesting right atrial pressure of 3 mmHg.   Comparison(s): No prior Echocardiogram.   CT Chest 01/25/22 ([place]):  IMPRESSION:  1. No cause for the patient's weight loss identified.  2. Calcified atherosclerosis in the nonaneurysmal aorta. Calcified  atherosclerotic change of the coronary arteries as above.  3. 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 Other etiologies are  possible. Regardless, 4 years of stability is consistent with a  benign etiology.  4. Stable benign pulmonary nodules and tree-in-bud opacities in the  right lung.   Aortic Atherosclerosis (ICD10-I70.0).   CT Angio Chest Aorta 11/24/2017: IMPRESSION: No acute finding.   Greatest diameter of the ascending thoracic aorta measures 3.7 cm. There is minimal atherosclerotic change of the thoracic aorta.   There is a 1 cm nodule at the periphery of the right lower lobe, with associated adjacent tree-in-bud nodularity. Consider one of the following in 3 months for both low-risk and high-risk individuals: (a) repeat chest CT, (b) follow-up PET-CT, or (c) tissue sampling. This recommendation follows the consensus statement: Guidelines for Management of Incidental Pulmonary Nodules Detected on CT Images: From the Fleischner Society 2017; Radiology 2017; 284:228-243.   Low-density lesion of the anterior mediastinum, 2.3 cm. Most likely the lesion is associated with thymic tissue, potentially a thymic cyst or thymoma. Less likely, this could represent adenopathy if the lesion of the right lower lobe proved to be malignant. Attention on future surveillance imaging is recommended, with or without referral for CT surgery evaluation.   Single phase imaging of the liver demonstrates 2 separate hyperenhancing regions. Although  most likely benign vascular lesions, these are not completely characterized on the current CT. Further imaging assessment may be warranted, such as contrast-enhanced CT or MRI.  EKG:  EKG was personally reviewed. 08/25/22: NSR, RBBB, HR: 79 bpm.  Recent Labs: No results found for requested labs within last 365 days.  Recent Lipid Panel No results found for: "CHOL", "TRIG", "HDL", "CHOLHDL", "VLDL", "LDLCALC", "LDLDIRECT"   Risk Assessment/Calculations:                Physical Exam:    VS:  BP 100/74   Pulse 83   Ht 6\' 1"  (1.854 m)   Wt 152 lb (68.9 kg)   SpO2 97%   BMI 20.05 kg/m     Wt Readings from Last 3 Encounters:  08/25/22 152 lb (68.9 kg)  05/04/22 140 lb 6.4 oz (63.7 kg)  05/05/21 166 lb 3.2 oz (75.4 kg)     GEN: Well nourished, well developed in no acute distress HEENT: Normal NECK: No JVD; No carotid bruits CARDIAC: RRR, no murmurs, rubs, gallops RESPIRATORY:  Clear to auscultation without rales, wheezing or rhonchi  ABDOMEN: Soft, non-tender, non-distended MUSCULOSKELETAL:  No edema; No deformity  SKIN: Warm and dry NEUROLOGIC:  Alert and oriented x 3 PSYCHIATRIC:  Normal affect   ASSESSMENT:    1. Unstable angina ([place])   2. Nonspecific abnormal electrocardiogram (ECG) (EKG)    PLAN:    In order of problems listed above:  #Chest Pain: #Concern for UA: Patient presents with progressive substernal chest burning radiating to his back that has been occurring with exertion and with stress. Symptoms have improved with sitting and resting. Developed chest burning while in clinic with isolated STE in III which appears different from prior but is in the setting of RBBB. Given symptoms and mild ECG changes, will plan to transfer to [facility] for cath today. Discussed with the patient at [place]. -Plan for LHC today -Will give ASA 324mg  with EMS -Will add GDMT as needed pending cath results  #HLD: -On crestor 5mg  daily; will adjust pending cath  findings   Shared Decision Making/Informed Consent The risks [stroke (1 in 1000), death (1 in 1000), kidney failure [usually temporary] (1 in 500), bleeding (1 in 200), allergic reaction [possibly serious] (1 in 200)], benefits (diagnostic support and management of coronary artery disease) and alternatives of a cardiac catheterization were discussed in detail with Mr. Manera and he is willing to  proceed.   Follow-up: Recommend admittance to the hospital for heart catheterization.  Medication Adjustments/Labs and Tests Ordered: Current medicines are reviewed at length with the patient today.  Concerns regarding medicines are outlined above.   Orders Placed This Encounter  Procedures   EKG 12-Lead   No orders of the defined types were placed in this encounter.  Patient Instructions  Medication Instructions:   Your physician recommends that you continue on your current medications as directed. Please refer to the Current Medication list given to you today.  *If you need a refill on your cardiac medications before your next appointment, please call your pharmacy*     Follow-Up:  YOU WILL BE TRANSPORTED EMS TO [place] FOR CHEST PAIN AND ABNORMAL EKG  FOLLOW-UP WILL BE [facility] [place]          I,Danny Valdes,acting as a scribe for Freada Bergeron, MD.,have documented all relevant documentation on the behalf of Freada Bergeron, MD,as directed by  Freada Bergeron, MD while in the presence of Freada Bergeron, MD.   I, Freada Bergeron, MD, have reviewed all documentation for this visit. The documentation on 08/25/22 for the exam, diagnosis, procedures, and orders are all accurate and complete.   Signed, Freada Bergeron, MD  08/25/2022 9:19 AM    [place]

## 2022-08-25 NOTE — ED Triage Notes (Signed)
Pt BIB [facility] from home d/t intermittent CP that started 4 days ago. Pt was at a cardiologist office where he had a EKG done which showed elevation in lead III. Vitals are stable. No other symptoms noted.  324 ASA and 0.4 nitro w no improvements.   Bp 110/60 HR 78 CBG 278

## 2022-08-25 NOTE — Discharge Instructions (Signed)

## 2022-08-28 ENCOUNTER — Encounter (HOSPITAL_COMMUNITY): Payer: Self-pay | Admitting: Cardiology

## 2022-08-29 ENCOUNTER — Ambulatory Visit: Payer: Medicare Other | Admitting: Physician Assistant

## 2022-09-03 NOTE — Progress Notes (Unsigned)
Cardiology Office Note:    Date:  09/05/2022   ID:  Scott Nichols, DOB 01/03/1951, MRN [id number]  PCP:  Herma Carson, MD   [facility] Providers Cardiologist:  Lesleigh Noe, MD     Referring MD: Herma Carson, MD   Chief Complaint: follow-up cardiac catheterization  History of Present Illness:    Scott Nichols is a very pleasant 71 y.o. male with a hx of RBBB, type 2 diabetes, aortic atherosclerosis, family history of CAD, family history of aortic dissection, dilated aorta, HLD,   He established care with cardiology and has maintained consistent follow-up.  He had a negative stress test in 2018.  CT of the aorta 11/2017 demonstrated greatest diameter of the ascending thoracic aorta measures 3.7 cm with minimal atherosclerotic change of the thoracic aorta.   Seen as an urgent work and by Dr. Shari Prows on 08/25/2022 for chest burning. His previous clinic visit was with Dr. Katrinka Blazing on 05/04/2022 when he was doing well from CV standpoint.  Called clinic on 08/24/2022 for episodes of intermittent chest burning ongoing for 2 weeks.  He described the pain as sharp and burning located in central chest with deep radiation towards his back.  Episodes were exacerbated with physical stress such as mowing or any other type of stress and had progressively worsened over the past 3 weeks.  EKG showed isolated STE in lead III, new from previous tracing. Due to symptoms and EKG changes he was transferred to [facility] for cardiac catheterization.  Cardiac catheterization 08/25/2022 revealed minimal nonobstructive CAD with moderate ectasia, normal LV function, normal LVEDP.  Today, he is here alone for follow-up. Continues to have chest burning that starts in left chest and radiates through to his back. Feels that symptoms are 2/2 anxiety.  Is working with PCP, counselor and behavioral health provider for appropriate medication for anxiety. Recently changed to venlafaxine and feels some improvement in  symptoms. Also uses Bible study and prayer for management of anxiety. Remains active with sport shooting and rides a recumbent bike and lifts hand weights for exercise.  Lost significant amount of weight after being diagnosed with diabetes several years ago and has to be careful to follow high protein diet. We discussed plant-based sources of protein in order to avoid saturated fat and cholesterol. He denies shortness of breath, lower extremity edema, fatigue, palpitations, melena, hematuria, hemoptysis, diaphoresis, weakness, presyncope, syncope, orthopnea, and PND.   Past Medical History:  Diagnosis Date   Back pain    back problems - herniated disc   Chicken pox    Chronic pain    Diabetes mellitus without complication (HCC)    Enlarged prostate    GERD (gastroesophageal reflux disease)    H/O colonoscopy    High cholesterol    Hyperlipidemia    Measles    Mumps    Neck injury    during [place] War   Vision problems     Past Surgical History:  Procedure Laterality Date   ADENOIDECTOMY     BACK SURGERY     COLONOSCOPY  01/20/2012   Normal   GALLBLADDER SURGERY     LEFT HEART CATH AND CORONARY ANGIOGRAPHY N/A 08/25/2022   Procedure: LEFT HEART CATH AND CORONARY ANGIOGRAPHY;  Surgeon: [place], Peter M, MD;  Location: [facility] INVASIVE CV LAB;  Service: Cardiovascular;  Laterality: N/A;   SPINE SURGERY     c-spine discectomy   TONSILLECTOMY      Current Medications: Current Meds  Medication Sig  ALPRAZolam (XANAX) 0.25 MG tablet Take 0.25 mg by mouth daily as needed.   Cholecalciferol 25 MCG (1000 UT) tablet Take 1,000 Units by mouth daily.   Flaxseed, Linseed, (FLAXSEED OIL) 1000 MG CAPS Take 4,000 mg by mouth daily.    glipiZIDE (GLUCOTROL XL) 5 MG 24 hr tablet Take 5 mg by mouth daily.   glucose blood (ONETOUCH ULTRA) test strip Use to check BS. E11.9   Lancets (ONETOUCH DELICA PLUS LANCET33G) MISC 1 Lancet by Other route daily as needed (as needed).   metFORMIN (GLUCOPHAGE)  500 MG tablet Take 500 mg by mouth 2 (two) times daily.   pantoprazole (PROTONIX) 40 MG tablet Take 40 mg by mouth daily.   polyethylene glycol (MIRALAX / GLYCOLAX) 17 g packet Take by mouth daily.   rosuvastatin (CRESTOR) 5 MG tablet Take 5 mg by mouth daily.   traZODone (DESYREL) 50 MG tablet Take 50 mg by mouth at bedtime.   venlafaxine XR (EFFEXOR-XR) 150 MG 24 hr capsule Take 150-300 mg by mouth at bedtime.   vitamin B-12 (CYANOCOBALAMIN) 500 MCG tablet Take 500 mcg by mouth 3 (three) times a week.     Allergies:   Metformin, Zocor [simvastatin], Buspirone, and Tamsulosin   Social History   Socioeconomic History   Marital status: Married    Spouse name: Not on file   Number of children: Not on file   Years of education: Not on file   Highest education level: Not on file  Occupational History   Not on file  Tobacco Use   Smoking status: Never   Smokeless tobacco: Never  Vaping Use   Vaping Use: Never used  Substance and Sexual Activity   Alcohol use: No   Drug use: No   Sexual activity: Not on file  Other Topics Concern   Not on file  Social History Narrative   Not on file   Social Determinants of Health   Financial Resource Strain: Not on file  Food Insecurity: Not on file  Transportation Needs: Not on file  Physical Activity: Not on file  Stress: Not on file  Social Connections: Not on file     Family History: The patient's family history includes Cancer - Prostate in his father; Congestive Heart Failure in his mother; Diabetes in his father and mother; Heart disease in his mother; Other (age of onset: 34) in his brother.  ROS:   Please see the history of present illness.    + chest burning All other systems reviewed and are negative.  Labs/Other Studies Reviewed:    The following studies were reviewed today:  LHC 08/25/22    The left ventricular systolic function is normal.   LV end diastolic pressure is normal.   The left ventricular ejection  fraction is 55-65% by visual estimate.   Minimal nonobstructive CAD with moderate ectasia Normal LV function Normal LVEDP   Plan: medical management. Anticipate DC same day  Echo 05/25/22   1. Left ventricular ejection fraction, by estimation, is 60 to 65%. The  left ventricle has normal function. The left ventricle has no regional  wall motion abnormalities. Left ventricular diastolic parameters are  consistent with Grade I diastolic  dysfunction (impaired relaxation).   2. Right ventricular systolic function is mildly reduced. The right  ventricular size is normal. Tricuspid regurgitation signal is inadequate  for assessing PA pressure.   3. The mitral valve is grossly normal. Trivial mitral valve  regurgitation.   4. The aortic valve is tricuspid. Aortic  valve regurgitation is trivial.  Aortic valve sclerosis is present, with no evidence of aortic valve  stenosis.   5. The inferior vena cava is normal in size with greater than 50%  respiratory variability, suggesting right atrial pressure of 3 mmHg.   Comparison(s): No prior Echocardiogram.   Recent Labs: 08/25/2022: ALT 18; BUN 17; Creatinine, Ser 1.02; Hemoglobin 18.0; Platelets 246; Potassium 4.4; Sodium 141  Recent Lipid Panel No results found for: "CHOL", "TRIG", "HDL", "CHOLHDL", "VLDL", "LDLCALC", "LDLDIRECT"   Risk Assessment/Calculations:         Physical Exam:    VS:  BP 112/64   Pulse 97   Ht 6\' 1"  (1.854 m)   Wt 149 lb 6.4 oz (67.8 kg)   SpO2 (!) 82%   BMI 19.71 kg/m     Wt Readings from Last 3 Encounters:  09/05/22 149 lb 6.4 oz (67.8 kg)  08/25/22 152 lb (68.9 kg)  05/04/22 140 lb 6.4 oz (63.7 kg)     GEN:  Well nourished, well developed in no acute distress HEENT: Normal NECK: No JVD; No carotid bruits CARDIAC: RRR, no murmurs, rubs, gallops RESPIRATORY:  Clear to auscultation without rales, wheezing or rhonchi  ABDOMEN: Soft, non-tender, non-distended MUSCULOSKELETAL:  No edema; No  deformity. 2+ pedal pulses, equal bilaterally SKIN: Warm and dry. Right radial cath site well-healed with minimal bruising.  NEUROLOGIC:  Alert and oriented x 3 PSYCHIATRIC:  Normal affect   EKG:  EKG is not ordered today.     Diagnoses:    1. Coronary artery disease involving native coronary artery of native heart without angina pectoris   2. Right bundle branch block   3. Hyperlipidemia LDL goal <70   4. Aortic atherosclerosis ([place])   5. Generalized anxiety disorder    Assessment and Plan:     CAD without angina/Aortic atherosclerosis: Office visit 08/25/2022 with symptoms concerning for unstable angina with isolated STE in lead III on EKG. He was transferred to [facility] for left heart cath which revealed minimal nonobstructive CAD with moderate echo to ectasia. Aortic atherosclerosis seen on prior imaging.  He continues to have symptoms of chest burning felt to be 2/2 anxiety. Cath site is well-healed. Encouraged him to resume moderate intensity exercise. Encouraged more plant based sources of protein in the place of steak.   Hyperlipidemia LDL goal < 70: LDL 76 on 05/12/22. Encouraged more plant based eating. Continue physical activty to achieve 150 minutes moderate intensity exercise each week.  Continue rosuvastatin.  RBBB: EKG not done today. Normal LVEF 60-65%, mildly reduced RV on echo 05/25/22. Continue to monitor with routine EKG.   Anxiety: Feels that chest burning is a symptom of anxiety.  Recently changed medications to start venlafaxine. Feels that he may have some improvement. Continue to follow with behavorial health providers.      Disposition: 1 year with Dr. Johney Frame (transfer from Dr. Tamala Julian)  Medication Adjustments/Labs and Tests Ordered: Current medicines are reviewed at length with the patient today.  Concerns regarding medicines are outlined above.  No orders of the defined types were placed in this encounter.  No orders of the defined types were placed in this  encounter.   Patient Instructions  Medication Instructions:   Your physician recommends that you continue on your current medications as directed. Please refer to the Current Medication list given to you today.   *If you need a refill on your cardiac medications before your next appointment, please call your pharmacy*   Lab Work:  None ordered.  If you have labs (blood work) drawn today and your tests are completely normal, you will receive your results only by: MyChart Message (if you have MyChart) OR A paper copy in the mail If you have any lab test that is abnormal or we need to change your treatment, we will call you to review the results.   Testing/Procedures:  None ordered.   Follow-Up: At [facility], you and your health needs are our priority.  As part of our continuing mission to provide you with exceptional heart care, we have created designated Provider Care Teams.  These Care Teams include your primary Cardiologist (physician) and Advanced Practice Providers (APPs -  Physician Assistants and Nurse Practitioners) who all work together to provide you with the care you need, when you need it.  We recommend signing up for the patient portal called "MyChart".  Sign up information is provided on this After Visit Summary.  MyChart is used to connect with patients for Virtual Visits (Telemedicine).  Patients are able to view lab/test results, encounter notes, upcoming appointments, etc.  Non-urgent messages can be sent to your provider as well.   To learn more about what you can do with MyChart, go to [email or web address].    Your next appointment:   1 year(s)  The format for your next appointment:   In Person  Provider:   Laurance Flatten, MD    Other Instructions  Your physician wants you to follow-up in: 1 year with Dr. Shari Prows. You will receive a reminder letter in the mail two months in advance. If you don't receive a letter, please call our office  to schedule the follow-up appointment.   Important Information About Sugar         Signed, Levi Aland, NP  09/05/2022 10:45 AM    [facility]

## 2022-09-05 ENCOUNTER — Encounter: Payer: Self-pay | Admitting: Nurse Practitioner

## 2022-09-05 ENCOUNTER — Ambulatory Visit: Payer: Medicare Other | Attending: Nurse Practitioner | Admitting: Nurse Practitioner

## 2022-09-05 VITALS — BP 112/64 | HR 97 | Ht 73.0 in | Wt 149.4 lb

## 2022-09-05 DIAGNOSIS — I700 Atherosclerosis of aorta: Secondary | ICD-10-CM

## 2022-09-05 DIAGNOSIS — F411 Generalized anxiety disorder: Secondary | ICD-10-CM

## 2022-09-05 DIAGNOSIS — I2510 Atherosclerotic heart disease of native coronary artery without angina pectoris: Secondary | ICD-10-CM

## 2022-09-05 DIAGNOSIS — I4510 Unspecified right bundle-branch block: Secondary | ICD-10-CM

## 2022-09-05 DIAGNOSIS — E785 Hyperlipidemia, unspecified: Secondary | ICD-10-CM | POA: Diagnosis not present

## 2022-09-05 NOTE — Patient Instructions (Signed)
Medication Instructions:   Your physician recommends that you continue on your current medications as directed. Please refer to the Current Medication list given to you today.   *If you need a refill on your cardiac medications before your next appointment, please call your pharmacy*   Lab Work:  None ordered.  If you have labs (blood work) drawn today and your tests are completely normal, you will receive your results only by: [place] (if you have MyChart) OR A paper copy in the mail If you have any lab test that is abnormal or we need to change your treatment, we will call you to review the results.   Testing/Procedures:  None ordered.   Follow-Up: At [facility], you and your health needs are our priority.  As part of our continuing mission to provide you with exceptional heart care, we have created designated Provider Care Teams.  These Care Teams include your primary Cardiologist (physician) and Advanced Practice Providers (APPs -  Physician Assistants and Nurse Practitioners) who all work together to provide you with the care you need, when you need it.  We recommend signing up for the patient portal called "MyChart".  Sign up information is provided on this After Visit Summary.  MyChart is used to connect with patients for Virtual Visits (Telemedicine).  Patients are able to view lab/test results, encounter notes, upcoming appointments, etc.  Non-urgent messages can be sent to your provider as well.   To learn more about what you can do with MyChart, go to [email or web address].    Your next appointment:   1 year(s)  The format for your next appointment:   In Person  Provider:   Gwyndolyn Kaufman, MD    Other Instructions  Your physician wants you to follow-up in: 1 year with Dr. Johney Frame. You will receive a reminder letter in the mail two months in advance. If you don't receive a letter, please call our office to schedule the follow-up  appointment.   Important Information About Sugar

## 2023-04-13 ENCOUNTER — Telehealth: Payer: Self-pay | Admitting: Cardiology

## 2023-04-13 NOTE — Telephone Encounter (Signed)
Patient wants provider switch to Dr. Allyson Sabal.  Please confirm.

## 2023-09-10 ENCOUNTER — Encounter: Payer: Self-pay | Admitting: Cardiovascular Disease

## 2023-09-10 ENCOUNTER — Ambulatory Visit: Payer: Medicare Other | Attending: Cardiovascular Disease | Admitting: Cardiovascular Disease

## 2023-09-10 VITALS — BP 108/60 | HR 78 | Ht 72.0 in | Wt 147.0 lb

## 2023-09-10 DIAGNOSIS — I2510 Atherosclerotic heart disease of native coronary artery without angina pectoris: Secondary | ICD-10-CM | POA: Diagnosis not present

## 2023-09-10 DIAGNOSIS — E782 Mixed hyperlipidemia: Secondary | ICD-10-CM | POA: Insufficient documentation

## 2023-09-10 DIAGNOSIS — I4510 Unspecified right bundle-branch block: Secondary | ICD-10-CM | POA: Diagnosis present

## 2023-09-10 DIAGNOSIS — I200 Unstable angina: Secondary | ICD-10-CM | POA: Insufficient documentation

## 2023-09-10 NOTE — Assessment & Plan Note (Signed)
Chronic. 

## 2023-09-10 NOTE — Progress Notes (Signed)
09/10/2023 Scott Nichols Nichols   03-03-1951  [id number]  Primary Physician Scott Nichols Carson, MD Primary Cardiologist: Scott Nichols Gess MD Scott Nichols Nichols, Scott Nichols Nichols  HPI:  Scott Nichols Nichols is a 72 y.o. 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  He was a patient of Dr. Garnette Nichols.  I am assuming his care after Scott Nichols Nichols retirement.  His cardiac risk factor profile is notable for treated hyperlipidemia and diabetes.  His father and brother both had bypass surgery.  He has never had a heart attack or stroke.  He is fairly active.  He had a cardiac catheterization performed by Scott Nichols Nichols 08/25/2022 which was essentially normal.  His chest pain was deemed related to anxiety for which she is on anxiety medication.  He currently denies chest pain or shortness of breath.   Current Meds  Medication Sig   ASPIRIN 81 PO Take 81 mg by mouth daily.   famotidine (PEPCID) 20 MG tablet Take 20 mg by mouth 2 (two) times daily.   Flaxseed, Linseed, (FLAXSEED OIL) 1000 MG CAPS Take 2,000 mg by mouth in the morning and at bedtime.   glipiZIDE (GLUCOTROL XL) 5 MG 24 hr tablet Take 705 mg by mouth in the morning and at bedtime.   glucose blood (ONETOUCH ULTRA) test strip Use to check BS. E11.9   Lancets (ONETOUCH DELICA PLUS LANCET33G) MISC 1 Lancet by Other route daily as needed (as needed).   metFORMIN (GLUCOPHAGE) 500 MG tablet Take 1,000 mg by mouth 2 (two) times daily.   polyethylene glycol (MIRALAX / GLYCOLAX) 17 g packet Take by mouth daily.   rosuvastatin (CRESTOR) 5 MG tablet Take 5 mg by mouth daily.   traZODone (DESYREL) 50 MG tablet Take 50 mg by mouth at bedtime.   Scott Nichols Nichols (EFFEXOR-Nichols) 150 MG 24 hr capsule Take 225 mg by mouth at bedtime.   [DISCONTINUED] Scott Nichols Nichols (XANAX) 0.25 MG tablet Take 0.25 mg by mouth daily as needed.   [DISCONTINUED] Scott Nichols Nichols  (XANAX) 0.25 MG tablet Take 1 tablet by mouth daily as needed.   [DISCONTINUED] Cholecalciferol 25 MCG (1000 UT) tablet Take 1,000 Units by mouth daily.   [DISCONTINUED] pantoprazole (PROTONIX) 40 MG tablet Take 40 mg by mouth daily.   [DISCONTINUED] vitamin B-12 (CYANOCOBALAMIN) 500 MCG tablet Take 500 mcg by mouth 3 (three) times a week.     Allergies  Allergen Reactions   Metformin Diarrhea and Nausea And Vomiting    "weight loss"   Zocor [Simvastatin] Other (See Comments)    Reaction: Arthralgia (joint pain)   Buspirone     Other reaction(s): Delusions (intolerance)   Tamsulosin     Other reaction(s): Dizziness (intolerance)    Social History   Socioeconomic History   Marital status: Married    Spouse name: Not on file   Number of children: Not on file   Years of education: Not on file   Highest education level: Not on file  Occupational History   Not on file  Tobacco Use   Smoking status: Never   Smokeless tobacco: Never  Vaping Use   Vaping status: Never Used  Substance and Sexual Activity   Alcohol use: No   Drug use: No   Sexual activity: Not on file  Other Topics Concern   Not on file  Social History Narrative   Not on file   Social Determinants  of Health   Financial Resource Strain: Low Risk  (07/09/2023)   Received from [facility]   Overall Financial Resource Strain (CARDIA)    Difficulty of Paying Living Expenses: Not hard at all  Food Insecurity: No Food Insecurity (07/09/2023)   Received from [facility]   Hunger Vital Sign    Worried About Running Out of Food in the Last Year: Never true    Ran Out of Food in the Last Year: Never true  Transportation Needs: No Transportation Needs (07/09/2023)   Received from [facility] - Transportation    Lack of Transportation (Medical): No    Lack of Transportation (Non-Medical): No  Physical Activity: Unknown (07/09/2023)   Received from [facility]   Exercise Vital Sign    Days of Exercise  per Week: Patient declined    Minutes of Exercise per Session: Not on file  Stress: Patient Declined (07/09/2023)   Received from [facility] - Occupational Stress Questionnaire    Feeling of Stress : Patient declined  Social Connections: Moderately Integrated (07/09/2023)   Received from [facility]   Social Network    How would you rate your social network (family, work, friends)?: Adequate participation with social networks  Intimate Partner Violence: Not At Risk (07/09/2023)   Received from [facility]   HITS    Over the last 12 months how often did your partner physically hurt you?: 1    Over the last 12 months how often did your partner insult you or talk down to you?: 1    Over the last 12 months how often did your partner threaten you with physical harm?: 1    Over the last 12 months how often did your partner scream or curse at you?: 1     Review of Systems: General: negative for chills, fever, night sweats or weight changes.  Cardiovascular: negative for chest pain, dyspnea on exertion, edema, orthopnea, palpitations, paroxysmal nocturnal dyspnea or shortness of breath Dermatological: negative for rash Respiratory: negative for cough or wheezing Urologic: negative for hematuria Abdominal: negative for nausea, vomiting, diarrhea, bright red blood per rectum, melena, or hematemesis Neurologic: negative for visual changes, syncope, or dizziness All other systems reviewed and are otherwise negative except as noted above.    Blood pressure 108/60, pulse 78, height 6' (1.829 m), weight 147 lb (66.7 kg).  General appearance: alert and no distress Neck: no adenopathy, no carotid bruit, no JVD, supple, symmetrical, trachea midline, and thyroid not enlarged, symmetric, no tenderness/mass/nodules Lungs: clear to auscultation bilaterally Heart: regular rate and rhythm, S1, S2 normal, no murmur, click, rub or gallop Extremities: extremities  normal, atraumatic, no cyanosis or edema Pulses: 2+ and symmetric Skin: Skin color, texture, turgor normal. No rashes or lesions Neurologic: Grossly normal  EKG EKG Interpretation Date/Time:  Monday September 10 2023 13:38:16 EST Ventricular Rate:  78 PR Interval:  134 QRS Duration:  152 QT Interval:  416 QTC Calculation: 474 R Axis:   90  Text Interpretation: Normal sinus rhythm Right bundle branch block When compared with ECG of 25-Aug-2022 10:12, T wave inversion now evident in Anterior leads Confirmed by Nanetta Batty [phone]) on 09/10/2023 1:41:41 PM    ASSESSMENT AND PLAN:   Hyperlipidemia History of hyperlipidemia on statin therapy with lipid profile performed 6 months ago revealed a total cholesterol of 52, LDL 72 and HDL 62.  Right bundle branch block Chronic     Scott Nichols Gess  MD FACP,FACC,FAHA, Franklyn Lor 09/10/2023 1:56 PM

## 2023-09-10 NOTE — Assessment & Plan Note (Signed)
History of hyperlipidemia on statin therapy with lipid profile performed 6 months ago revealed a total cholesterol of 52, LDL 72 and HDL 62.

## 2023-09-10 NOTE — Patient Instructions (Signed)
    Follow-Up: At [facility], you and your health needs are our priority.  As part of our continuing mission to provide you with exceptional heart care, we have created designated Provider Care Teams.  These Care Teams include your primary Cardiologist (physician) and Advanced Practice Providers (APPs -  Physician Assistants and Nurse Practitioners) who all work together to provide you with the care you need, when you need it.  We recommend signing up for the patient portal called "MyChart".  Sign up information is provided on this After Visit Summary.  MyChart is used to connect with patients for Virtual Visits (Telemedicine).  Patients are able to view lab/test results, encounter notes, upcoming appointments, etc.  Non-urgent messages can be sent to your provider as well.   To learn more about what you can do with MyChart, go to [email or web address].    Your next appointment:   12 month(s)  Provider:   Jonathan Berry MD   

## 2024-01-31 ENCOUNTER — Telehealth: Payer: Self-pay | Admitting: Cardiovascular Disease

## 2024-01-31 NOTE — Telephone Encounter (Signed)
 Pt requesting provider switch to Dr. Dagmar Hait is possible. Please advise

## 2024-09-05 ENCOUNTER — Ambulatory Visit: Attending: Internal Medicine | Admitting: Internal Medicine

## 2024-09-05 VITALS — BP 112/73 | HR 74 | Ht 73.0 in | Wt 152.0 lb

## 2024-09-05 DIAGNOSIS — I200 Unstable angina: Secondary | ICD-10-CM | POA: Insufficient documentation

## 2024-09-05 DIAGNOSIS — E782 Mixed hyperlipidemia: Secondary | ICD-10-CM | POA: Insufficient documentation

## 2024-09-05 DIAGNOSIS — I351 Nonrheumatic aortic (valve) insufficiency: Secondary | ICD-10-CM | POA: Insufficient documentation

## 2024-09-05 DIAGNOSIS — I2510 Atherosclerotic heart disease of native coronary artery without angina pectoris: Secondary | ICD-10-CM | POA: Diagnosis present

## 2024-09-05 DIAGNOSIS — I4510 Unspecified right bundle-branch block: Secondary | ICD-10-CM | POA: Insufficient documentation

## 2024-09-05 NOTE — Patient Instructions (Signed)
 Medication Instructions:  Your physician recommends that you continue on your current medications as directed. Please refer to the Current Medication list given to you today.  *If you need a refill on your cardiac medications before your next appointment, please call your pharmacy*  Testing/Procedures: Echocardiogram - September 2026 Your physician has requested that you have an echocardiogram. Echocardiography is a painless test that uses sound waves to create images of your heart. It provides your doctor with information about the size and shape of your heart and how well your heart's chambers and valves are working. This procedure takes approximately one hour. There are no restrictions for this procedure. Please do NOT wear cologne, perfume, aftershave, or lotions (deodorant is allowed). Please arrive 15 minutes prior to your appointment time.  Please note: We ask at that you not bring children with you during ultrasound (echo/ vascular) testing. Due to room size and safety concerns, children are not allowed in the ultrasound rooms during exams. Our front office staff cannot provide observation of children in our lobby area while testing is being conducted. An adult accompanying a patient to their appointment will only be allowed in the ultrasound room at the discretion of the ultrasound technician under special circumstances. We apologize for any inconvenience.  Follow-Up: At [facility], you and your health needs are our priority.  As part of our continuing mission to provide you with exceptional heart care, our providers are all part of one team.  This team includes your primary Cardiologist (physician) and Advanced Practice Providers or APPs (Physician Assistants and Nurse Practitioners) who all work together to provide you with the care you need, when you need it.  Your next appointment:   1 year  Provider:   Stanly DELENA Leavens, MD

## 2024-09-05 NOTE — Progress Notes (Signed)
 Cardiology Office Note:  .    Date:  09/05/2024  ID:  Scott Nichols, DOB Nov 01, 1951, MRN [id number] PCP: Alray Loader, MD  [facility] Providers Cardiologist:  Stanly DELENA Leavens, MD     CC: Transition to new cardiologist  History of Present Illness: Scott    Demarcus Nichols is a 74 y.o. male with non-obstructive coronary artery disease and diabetes who presents for secondary prevention and management of his cardiovascular conditions.  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 A heart catheterization in October 2023 revealed mild non-obstructive coronary disease and moderate coronary artery ectasia. He experiences no chest pain or significant symptoms during physical activity, such as walking a mile or using a combo bike indoors.  He manages his diabetes with a Dexcom device, resulting in an improved A1c from 7.8 to 6.2. He monitors his glucose levels throughout the day and adjusts his meals accordingly. Regular physical activity, including walking a mile outside or using a combo bike indoors, has increased his energy levels.  He experiences night sweats over the past year, occurring a couple of times a night. He denies sleeping warm, as he sleeps without blankets in a room with a temperature of 70 degrees. No specific cause for these night sweats has been identified.  He has hyperlipidemia, managed with rosuvastatin 5 mg. His LDL cholesterol was slightly above goal in 2024 but has since improved, with recent labs showing LDL under 70.  He has a known right bundle branch block, with a recent EKG showing decreased QRS duration and decent sinus rhythm. Mild dilation of the right ventricle has been observed.  He has a family history of aortic dissection, but his own aortic measurements from 2023 were within normal limits, with trivial aortic regurgitation noted. Mild dilation of the right ventricle and a right bundle branch block are  present.  Discussed the use of AI scribe software for clinical note transcription with the patient, who gave verbal consent to proceed.  Relevant histories: .  Social  - former HS, HP, JB patient - Fhx of aortic dissection (brother, happened twice) ROS: As per HPI.   Studies Reviewed: .     Cardiac Studies & Procedures   ______________________________________________________________________________________________ CARDIAC CATHETERIZATION  CARDIAC CATHETERIZATION 08/25/2022  Conclusion   The left ventricular systolic function is normal.   LV end diastolic pressure is normal.   The left ventricular ejection fraction is 55-65% by visual estimate.  Minimal nonobstructive CAD with moderate ectasia Normal LV function Normal LVEDP  Plan: medical management. Anticipate DC same day  Findings Coronary Findings Diagnostic  Dominance: Right  Left Main Vessel was injected. Vessel is normal in caliber. Vessel is angiographically normal.  Left Anterior Descending Vessel was injected. Vessel is normal in caliber. The vessel exhibits minimal luminal irregularities. The vessel is moderately ectatic.  Left Circumflex Vessel was injected. Vessel is normal in caliber. The vessel exhibits minimal luminal irregularities. The vessel is moderately ectatic.  Right Coronary Artery Vessel was injected. Vessel is normal in caliber. The vessel exhibits minimal luminal irregularities.  Intervention  No interventions have been documented.     ECHOCARDIOGRAM  ECHOCARDIOGRAM COMPLETE 05/25/2022  Narrative ECHOCARDIOGRAM REPORT    Patient Name:   Scott Nichols Date of Exam: 05/25/2022 Medical Rec #:  [id number]     Height:       73.0 in Accession #:    [id number]    Weight:       140.4 lb Date of  Birth:  [birth year]     BSA:          1.851 m Patient Age:    71 years      BP:           113/73 mmHg Patient Gender: M             HR:           71 bpm. Exam Location:  [address]  Procedure: 2D Echo, Cardiac Doppler and Color Doppler  Indications:    I45.10 RBBB  History:        Patient has no prior history of Echocardiogram examinations. Signs/Symptoms:Dizziness/Lightheadedness; Risk Factors:Diabetes, Family History of Coronary Artery Disease and HLD.  Sonographer:    Waldo Guadalajara RCS Referring Phys: [phone] [facility]  IMPRESSIONS   1. Left ventricular ejection fraction, by estimation, is 60 to 65%. The left ventricle has normal function. The left ventricle has no regional wall motion abnormalities. Left ventricular diastolic parameters are consistent with Grade I diastolic dysfunction (impaired relaxation). 2. Right ventricular systolic function is mildly reduced. The right ventricular size is normal. Tricuspid regurgitation signal is inadequate for assessing PA pressure. 3. The mitral valve is grossly normal. Trivial mitral valve regurgitation. 4. The aortic valve is tricuspid. Aortic valve regurgitation is trivial. Aortic valve sclerosis is present, with no evidence of aortic valve stenosis. 5. The inferior vena cava is normal in size with greater than 50% respiratory variability, suggesting right atrial pressure of 3 mmHg.  Comparison(s): No prior Echocardiogram.  FINDINGS Left Ventricle: Left ventricular ejection fraction, by estimation, is 60 to 65%. The left ventricle has normal function. The left ventricle has no regional wall motion abnormalities. 3D left ventricular ejection fraction analysis performed but not reported based on interpreter judgement due to suboptimal tracking. The left ventricular internal cavity size was normal in size. There is no left ventricular hypertrophy. Left ventricular diastolic parameters are consistent with Grade I diastolic dysfunction (impaired relaxation). Normal left ventricular filling pressure.  Right Ventricle: The right ventricular size is normal. No increase in right ventricular wall thickness. Right  ventricular systolic function is mildly reduced. Tricuspid regurgitation signal is inadequate for assessing PA pressure.  Left Atrium: Left atrial size was normal in size.  Right Atrium: Right atrial size was normal in size.  Pericardium: There is no evidence of pericardial effusion.  Mitral Valve: The mitral valve is grossly normal. Trivial mitral valve regurgitation.  Tricuspid Valve: The tricuspid valve is grossly normal. Tricuspid valve regurgitation is trivial.  Aortic Valve: The aortic valve is tricuspid. Aortic valve regurgitation is trivial. Aortic regurgitation PHT measures 675 msec. Aortic valve sclerosis is present, with no evidence of aortic valve stenosis.  Pulmonic Valve: The pulmonic valve was grossly normal. Pulmonic valve regurgitation is mild.  Aorta: The aortic root and ascending aorta are structurally normal, with no evidence of dilitation.  Venous: The inferior vena cava is normal in size with greater than 50% respiratory variability, suggesting right atrial pressure of 3 mmHg.  IAS/Shunts: No atrial level shunt detected by color flow Doppler.   LEFT VENTRICLE PLAX 2D LVIDd:         5.10 cm   Diastology LVIDs:         3.00 cm   LV e' medial:    10.70 cm/s LV PW:         0.80 cm   LV E/e' medial:  5.4 LV IVS:        0.50 cm  LV e' lateral:   11.70 cm/s LVOT diam:     2.00 cm   LV E/e' lateral: 5.0 LV SV:         60 LV SV Index:   33 LVOT Area:     3.14 cm  3D Volume EF: 3D EF:        52 % LV EDV:       125 ml LV ESV:       60 ml LV SV:        64 ml  RIGHT VENTRICLE RV Basal diam:  4.10 cm RV Mid diam:    3.50 cm RV S prime:     9.57 cm/s TAPSE (M-mode): 2.7 cm  LEFT ATRIUM             Index        RIGHT ATRIUM           Index LA diam:        3.00 cm 1.62 cm/m   RA Area:     16.60 cm LA Vol (A2C):   48.7 ml 26.31 ml/m  RA Volume:   39.70 ml  21.45 ml/m LA Vol (A4C):   37.0 ml 19.99 ml/m LA Biplane Vol: 43.5 ml 23.50 ml/m AORTIC VALVE LVOT  Vmax:   91.00 cm/s LVOT Vmean:  61.150 cm/s LVOT VTI:    0.192 m AI PHT:      675 msec  AORTA Ao Root diam: 3.40 cm  MITRAL VALVE MV Area (PHT):             SHUNTS MV Decel Time:             Systemic VTI:  0.19 m MV E velocity: 58.20 cm/s  Systemic Diam: 2.00 cm MV A velocity: 60.30 cm/s MV E/A ratio:  0.97  Vinie Maxcy MD Electronically signed by Vinie Maxcy MD Signature Date/Time: 05/25/2022/4:16:08 PM    Final          ______________________________________________________________________________________________        Physical Exam:    VS:  BP 112/73   Pulse 74   Ht 6' 1 (1.854 m)   Wt 152 lb (68.9 kg)   SpO2 96%   BMI 20.05 kg/m    Wt Readings from Last 3 Encounters:  09/05/24 152 lb (68.9 kg)  09/10/23 147 lb (66.7 kg)  09/05/22 149 lb 6.4 oz (67.8 kg)    Gen: no distress Cardiac: No Rubs or Gallops, no Murmur, RRR +2 radial pulses Respiratory: Clear to auscultation bilaterally, normal effort, normal  respiratory rate GI: Soft, nontender, non-distended  MS: No  edema;  moves all extremities Integument: Skin feels warm Neuro:  At time of evaluation, alert and oriented to person/place/time/situation  Psych: Normal affect, patient feels ok       ASSESSMENT AND PLAN: .    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 No obstructive disease on heart catheterization in 2023. LDL cholesterol improved to under 70. No angina or significant coronary symptoms. Night sweats present but not typical for coronary artery disease. Greater than 4 fMETS - Continue current management with rosuvastatin 5 mg daily. - Monitor cholesterol levels and maintain lifestyle modifications including diet and exercise. - No stress testing recommended unless new symptoms develop. - Improved LDL  levels under 70. Current management with rosuvastatin 5 mg daily  is effective. Lifestyle modifications including diet and exercise are contributing to improved lipid profile. - if planned for BPH surgical intervention likely low risk and reasonable to proceed unless clinical status change  Mild aortic regurgitation with mild right ventricular dilation (Fhx of dissection in brother) Mild aortic regurgitation with trivial to mild leak. Mild right ventricular dilation noted on echocardiogram. No significant symptoms. Aortic measurements from 2023 were under 40, indicating mild dilation rather than aneurysm. - Will repeat echocardiogram in 11 months to assess aortic regurgitation and right ventricular dilation. - Monitor for any new symptoms or changes in condition.  Right bundle branch block Noted on EKG with decreased QRS duration. No significant symptoms or need for pacemaker at this time.  One year f/u with me or my team.  Stanly Leavens, MD FASE [facility] Cardiologist [facility]  [address] [place], [address] [place], [place] [address] [phone]  12:32 PM

## 2025-07-28 ENCOUNTER — Ambulatory Visit (HOSPITAL_COMMUNITY)
